# Patient Record
Sex: Female | Born: 1955 | Race: White | Hispanic: No | Marital: Married | Smoking: Never smoker
Health system: Southern US, Community
[De-identification: ages and names within clinical notes are randomized; demographics above are authoritative.]

## PROBLEM LIST (undated history)

## (undated) DIAGNOSIS — K469 Unspecified abdominal hernia without obstruction or gangrene: Secondary | ICD-10-CM

## (undated) DIAGNOSIS — R5383 Other fatigue: Secondary | ICD-10-CM

## (undated) DIAGNOSIS — R51 Headache: Secondary | ICD-10-CM

## (undated) DIAGNOSIS — R1013 Epigastric pain: Secondary | ICD-10-CM

## (undated) DIAGNOSIS — R1310 Dysphagia, unspecified: Secondary | ICD-10-CM

## (undated) DIAGNOSIS — R519 Headache, unspecified: Secondary | ICD-10-CM

## (undated) DIAGNOSIS — K219 Gastro-esophageal reflux disease without esophagitis: Secondary | ICD-10-CM

## (undated) DIAGNOSIS — M1990 Unspecified osteoarthritis, unspecified site: Secondary | ICD-10-CM

## (undated) HISTORY — DX: Other fatigue: R53.83

## (undated) HISTORY — DX: Headache: R51

## (undated) HISTORY — DX: Unspecified abdominal hernia without obstruction or gangrene: K46.9

## (undated) HISTORY — DX: Epigastric pain: R10.13

## (undated) HISTORY — PX: SPINAL FUSION: SHX223

## (undated) HISTORY — DX: Unspecified osteoarthritis, unspecified site: M19.90

## (undated) HISTORY — DX: Dysphagia, unspecified: R13.10

## (undated) HISTORY — PX: TMJ ARTHROPLASTY: SHX1066

## (undated) HISTORY — DX: Headache, unspecified: R51.9

## (undated) HISTORY — DX: Gastro-esophageal reflux disease without esophagitis: K21.9

---

## 2003-11-28 HISTORY — PX: MENISCUS REPAIR: SHX5179

## 2009-06-07 ENCOUNTER — Ambulatory Visit (HOSPITAL_COMMUNITY): Admission: RE | Admit: 2009-06-07 | Discharge: 2009-06-07 | Payer: Self-pay | Admitting: Surgery

## 2009-06-14 ENCOUNTER — Encounter: Admission: RE | Admit: 2009-06-14 | Discharge: 2009-06-14 | Payer: Self-pay | Admitting: Surgery

## 2010-09-30 ENCOUNTER — Encounter: Admission: RE | Admit: 2010-09-30 | Discharge: 2010-09-30 | Payer: Self-pay | Admitting: Surgery

## 2010-10-27 HISTORY — PX: LAPAROSCOPIC GASTRIC BANDING: SHX1100

## 2010-11-15 ENCOUNTER — Ambulatory Visit (HOSPITAL_COMMUNITY)
Admission: RE | Admit: 2010-11-15 | Discharge: 2010-11-16 | Payer: Self-pay | Source: Home / Self Care | Attending: Surgery | Admitting: Surgery

## 2010-11-16 ENCOUNTER — Encounter (INDEPENDENT_AMBULATORY_CARE_PROVIDER_SITE_OTHER): Payer: Self-pay | Admitting: Surgery

## 2011-02-06 LAB — CBC
HCT: 35 % — ABNORMAL LOW (ref 36.0–46.0)
HCT: 43.3 % (ref 36.0–46.0)
Hemoglobin: 11.6 g/dL — ABNORMAL LOW (ref 12.0–15.0)
MCHC: 33.5 g/dL (ref 30.0–36.0)
MCV: 90.2 fL (ref 78.0–100.0)
MCV: 91.1 fL (ref 78.0–100.0)
RBC: 3.84 MIL/uL — ABNORMAL LOW (ref 3.87–5.11)
RDW: 12.9 % (ref 11.5–15.5)
RDW: 13.4 % (ref 11.5–15.5)
WBC: 8.1 10*3/uL (ref 4.0–10.5)

## 2011-02-06 LAB — COMPREHENSIVE METABOLIC PANEL
Alkaline Phosphatase: 58 U/L (ref 39–117)
BUN: 11 mg/dL (ref 6–23)
CO2: 29 mEq/L (ref 19–32)
Calcium: 9.7 mg/dL (ref 8.4–10.5)
GFR calc non Af Amer: >60 mL/min (ref >60)
Glucose, Bld: 91 mg/dL (ref 70–99)
Potassium: 4.1 mEq/L (ref 3.5–5.1)
Total Protein: 7.8 g/dL (ref 6.0–8.3)

## 2011-02-06 LAB — DIFFERENTIAL
Basophils Relative: 1 % (ref 0–1)
Eosinophils Relative: 0 % (ref 0–5)
Lymphocytes Relative: 14 % (ref 12–46)
Lymphs Abs: 1.1 10*3/uL (ref 0.7–4.0)
Monocytes Absolute: 0.6 10*3/uL (ref 0.1–1.0)
Monocytes Relative: 7 % (ref 3–12)
Monocytes Relative: 7 % (ref 3–12)
Neutro Abs: 1.4 10*3/uL — ABNORMAL LOW (ref 1.7–7.7)
Neutro Abs: 6.4 10*3/uL (ref 1.7–7.7)
Neutrophils Relative %: 34 % — ABNORMAL LOW (ref 43–77)

## 2011-02-06 LAB — LIPID PANEL
Cholesterol: 208 mg/dL — ABNORMAL HIGH (ref 0–200)
LDL Cholesterol: 124 mg/dL — ABNORMAL HIGH (ref 0–99)
Triglycerides: 98 mg/dL (ref <150)
VLDL: 20 mg/dL (ref 0–40)

## 2011-02-06 LAB — HEMOGLOBIN AND HEMATOCRIT, BLOOD: HCT: 37.7 % (ref 36.0–46.0)

## 2011-04-11 NOTE — Op Note (Signed)
NAMESHAKOYA, GILMORE            ACCOUNT NO.:  [id number]   MEDICAL RECORD NO.:  [id number]          PATIENT TYPE:  AMB   LOCATION:  ENDO                         FACILITY:  [facility]   PHYSICIAN:  Sandria Bales. Ezzard Standing, M.D.  DATE OF BIRTH:  Dec 25, 1955   DATE OF PROCEDURE:  06/07/2009  DATE OF DISCHARGE:  06/07/2009                               OPERATIVE REPORT   Date of Surgery ?   PREOPERATIVE DIAGNOSES:  Status post lap band placement with hiatal  hernia.   POSTOPERATIVE DIAGNOSES:  Esophagogastric junction at 35 cm, lap band  placed at 40 cm with small hiatal hernia.  No evidence of duodenal,  gastric or esophageal mass or growth.   PROCEDURE:  Esophagogastroduodenoscopy.   SURGEON:  Dr. Ezzard Standing.   FIRST ASSISTANT:  None.   ANESTHESIA:  50 mg of fentanyl, 5 mg of Versed.   COMPLICATIONS:  None.   INDICATIONS FOR PROCEDURE:  Ms. Azua is a 55 year old white female  who is a patient of Primus Bravo, nurse practitioner at [facility], who had a lap band placed in [place] in January 2008.  She gets her band fills in [place] with her last band fill 3-4  months ago.  She complains of significant reflux and was concerned about  having a possible either tumor of her esophagus or some form of reflux.   She did have some trouble with reflux and indigestion and was seen.  She  underwent an upper GI at [facility] on Apr 17, 2009, which  showed a small hiatal hernia.  They did not describe much of lap band in  the report, but anyway she asked for upper endoscopy to evaluate and see  if there was any problems.   She is seen by Dr. Wenda Low in our practice.  She understands the  risks of upper endoscopy.   PROCEDURE:  A time out was held to identify the patient and procedure.  The patient  was placed in the left lateral decubitus position.  Her throat was  anesthetized with Cetacaine.  She was given 50 mcg of fentanyl with 5 mg  of Versed.   The flexible Pentax endoscope was passed down the back of her throat  without difficulty into her stomach and into the duodenum.  I continued  to the first and second portion of the duodenum which was unremarkable.  She did have a remote history of H. pylori with ulcer disease, but I saw  no stigmata of this.  Her pylorus was widely patent without inflammation  or bleeding.  Her stomach had normal rugal folds without inflammation,  bleeding or a tumor.  I retroflexed the scope and was able to see the  undersurface of the band.  I did take photos of this and placed in her  chart.  I then pulled the scope back through the band which was about 40  cm.  Her esophagogastric junction was noted to be about 35 cm.  There  was no ulceration, no mucosal lesion, and again her band defect was at  40 cm and her esophagogastric junction at 35.  I took pictures and put these in the chart.  She tolerated the procedure  well.  I talked to Dr. Daphine Deutscher about further evaluation and whether to do  anything with this hiatal hernia or not.      Sandria Bales. Ezzard Standing, M.D.  Electronically Signed     DHN/MEDQ  D:  06/07/2009  T:  06/08/2009  Job:  [id number]   cc:   Primus Bravo, NP  [facility]   [place]. Daphine Deutscher, MD  [address]  [place]  [place] [address]

## 2011-04-14 ENCOUNTER — Encounter (INDEPENDENT_AMBULATORY_CARE_PROVIDER_SITE_OTHER): Payer: Self-pay | Admitting: Surgery

## 2011-08-18 ENCOUNTER — Ambulatory Visit (INDEPENDENT_AMBULATORY_CARE_PROVIDER_SITE_OTHER): Payer: BC Managed Care – PPO | Admitting: Physician Assistant

## 2011-08-18 ENCOUNTER — Encounter (INDEPENDENT_AMBULATORY_CARE_PROVIDER_SITE_OTHER): Payer: Self-pay

## 2011-08-18 VITALS — BP 114/76 | HR 80 | Temp 97.4°F | Resp 18 | Ht 65.0 in | Wt 165.5 lb

## 2011-08-18 DIAGNOSIS — K219 Gastro-esophageal reflux disease without esophagitis: Secondary | ICD-10-CM

## 2011-08-18 DIAGNOSIS — Z4651 Encounter for fitting and adjustment of gastric lap band: Secondary | ICD-10-CM

## 2011-08-18 MED ORDER — PANTOPRAZOLE SODIUM 40 MG PO TBEC: 40.0000 mg | DELAYED_RELEASE_TABLET | Freq: Every day | ORAL | Status: DC

## 2011-08-18 NOTE — Patient Instructions (Signed)
Take protonix as prescribed. Follow-up with Dr. Ezzard Standing in the next month. Return if worse.

## 2011-08-18 NOTE — Progress Notes (Signed)
  HISTORY: TAYLEE GUNNELLS is a 55 y.o.female who received an AP-Large lap-band as a revision in December 2011 by Dr. Daphine Deutscher. She comes in today with worsening reflux. She constantly tastes gastric juices, especially at night and she is unable to sleep on her side. Food is now very irritating to her. She's very worried about sequelae of esophagitis.  VITAL SIGNS: Filed Vitals:   08/18/11 1404  BP: 114/76  Pulse: 80  Temp: 97.4 F (36.3 C)  Resp: 18    PHYSICAL EXAM: Physical exam reveals a very well-appearing 55 y.o.female in no apparent distress Neurologic: Awake, alert, oriented Psych: Bright affect, conversant Respiratory: Breathing even and unlabored. No stridor or wheezing Abdomen: Soft, nontender, nondistended to palpation. Incisions well-healed. No incisional hernias. Port easily palpated. Extremities: Atraumatic, good range of motion.  ASSESMENT: 55 y.o.  female  s/p AP-Large lap-band as a revision.   PLAN: The patient's port was accessed with a 20G Huber needle without difficulty. Clear fluid was aspirated and 0.25 mL saline was removed from the port. She had significant belching with relief of some of her abdominal pressure/bloating following the fluid removal. She has been prescribed protonix daily. We'll set her up with Dr. Ezzard Standing for discussion of possible upper endoscopy, primarily because of the length and degree of symptoms.

## 2011-08-23 ENCOUNTER — Telehealth (INDEPENDENT_AMBULATORY_CARE_PROVIDER_SITE_OTHER): Payer: Self-pay

## 2011-08-23 DIAGNOSIS — K219 Gastro-esophageal reflux disease without esophagitis: Secondary | ICD-10-CM

## 2011-08-23 NOTE — Telephone Encounter (Signed)
I called pt and notified her that Dr Ezzard Standing recommends she have an UGI and then follow up with Dr Daphine Deutscher since he did a revision in December.  Mardelle Matte saw the pt last week and recommend she see Dr Ezzard Standing and have an upper endo.  We will set up the UGI and have her see Dr Daphine Deutscher soon.

## 2011-08-25 ENCOUNTER — Encounter (INDEPENDENT_AMBULATORY_CARE_PROVIDER_SITE_OTHER): Payer: Self-pay | Admitting: Physician Assistant

## 2011-08-25 ENCOUNTER — Ambulatory Visit
Admission: RE | Admit: 2011-08-25 | Discharge: 2011-08-25 | Disposition: A | Discharge status: Home / Self Care | Payer: BC Managed Care – PPO | Source: Ambulatory Visit | Attending: Surgery | Admitting: Surgery

## 2011-08-25 ENCOUNTER — Ambulatory Visit (INDEPENDENT_AMBULATORY_CARE_PROVIDER_SITE_OTHER): Payer: BC Managed Care – PPO | Admitting: Physician Assistant

## 2011-08-25 VITALS — BP 116/78 | HR 64 | Temp 97.5°F | Resp 16 | Ht 65.0 in | Wt 167.2 lb

## 2011-08-25 DIAGNOSIS — Z4651 Encounter for fitting and adjustment of gastric lap band: Secondary | ICD-10-CM

## 2011-08-25 NOTE — Patient Instructions (Signed)
Follow-up with Dr. Martin. 

## 2011-08-25 NOTE — Progress Notes (Signed)
  HISTORY: Beth Wright is a 55 y.o.female who received an AP-Large lap-band in December 2011 by Dr. Daphine Deutscher. She was seen last week for obstructive symptoms and a small amount of fluid was removed. She underwent an upper GI today which revealed persistent obstruction, with some retained solid food. She's been on liquids for a week until last night when she had broccoli, which she regurgitated.  VITAL SIGNS: Filed Vitals:   08/25/11 1126  BP: 116/78  Pulse: 64  Temp: 97.5 F (36.4 C)  Resp: 16    PHYSICAL EXAM: Physical exam reveals a very well-appearing 55 y.o.female in no apparent distress Neurologic: Awake, alert, oriented Psych: Bright affect, conversant Respiratory: Breathing even and unlabored. No stridor or wheezing Abdomen: Soft, nontender, nondistended to palpation. Incisions well-healed. No incisional hernias. Port easily palpated. Extremities: Atraumatic, good range of motion.  ASSESMENT: 55 y.o.  female  s/p AP-Large lap-band as a revision from a 10 cm band.   PLAN: The patient's port was accessed with a 20G Huber needle without difficulty. Clear fluid was aspirated and 1.0 mL saline was removed from the port. She felt considerably better and it felt as if the retained food went down. She's to see Dr. Daphine Deutscher at the next available opportunity.

## 2011-08-28 ENCOUNTER — Telehealth (INDEPENDENT_AMBULATORY_CARE_PROVIDER_SITE_OTHER): Payer: Self-pay | Admitting: General Surgery

## 2011-08-28 NOTE — Telephone Encounter (Signed)
Contacted patient and she is feeling better, no n/v will call if any further problems

## 2011-09-06 DIAGNOSIS — I83893 Varicose veins of bilateral lower extremities with other complications: Secondary | ICD-10-CM

## 2011-09-06 HISTORY — DX: Varicose veins of bilateral lower extremities with other complications: I83.893

## 2011-09-07 ENCOUNTER — Encounter (INDEPENDENT_AMBULATORY_CARE_PROVIDER_SITE_OTHER): Payer: BC Managed Care – PPO | Admitting: Surgery

## 2011-09-14 ENCOUNTER — Ambulatory Visit (INDEPENDENT_AMBULATORY_CARE_PROVIDER_SITE_OTHER): Payer: BC Managed Care – PPO | Admitting: Surgery

## 2011-09-14 ENCOUNTER — Encounter (INDEPENDENT_AMBULATORY_CARE_PROVIDER_SITE_OTHER): Payer: Self-pay | Admitting: Surgery

## 2011-09-14 VITALS — BP 114/82 | HR 64 | Temp 98.2°F | Resp 20 | Ht 65.0 in | Wt 167.4 lb

## 2011-09-14 DIAGNOSIS — Z4651 Encounter for fitting and adjustment of gastric lap band: Secondary | ICD-10-CM

## 2011-09-14 DIAGNOSIS — Z9884 Bariatric surgery status: Secondary | ICD-10-CM

## 2011-09-14 NOTE — Patient Instructions (Signed)

## 2011-09-14 NOTE — Progress Notes (Signed)
Beth Wright comes in today after laser facial surgery and some perimenopausal menses...both with fluid retention.  She wanted some fluid added to her band. I placed 0.5 cc to the band and she was able to drink fine.  She suspects that flying may be affecting her band tightness. She is traveling a lot by air. Today's BMI is 27.5. Weight 167.6.Marland Kitchen  She is getting some insight into her band and its tightness with fluid retention. We'll see she's done about 6 weeks. She is somewhat frustrated she's not able to lose the weight quite rapidly. Plan

## 2011-10-12 DIAGNOSIS — N951 Menopausal and female climacteric states: Secondary | ICD-10-CM

## 2011-10-12 DIAGNOSIS — R002 Palpitations: Secondary | ICD-10-CM

## 2011-10-12 HISTORY — DX: Menopausal and female climacteric states: N95.1

## 2011-10-12 HISTORY — DX: Palpitations: R00.2

## 2011-11-13 DIAGNOSIS — I781 Nevus, non-neoplastic: Secondary | ICD-10-CM

## 2011-11-13 HISTORY — DX: Nevus, non-neoplastic: I78.1

## 2011-11-29 IMAGING — RF DG UGI W/ KUB
11 of 14 series · 18 of 24 positions shown · non-contrast
Comparison: Postop study of 11/16/2010

CLINICAL DATA: Esophagitis, possible reflux

UPPER GI SERIES WITH KUB
TECHNIQUE: Routine upper GI series was performed with thin barium
Fluoroscopy Time: 1.4 minutes

[Series 1: run · 1 of 7 slices shown (1 of 10)]
[im 1/7]
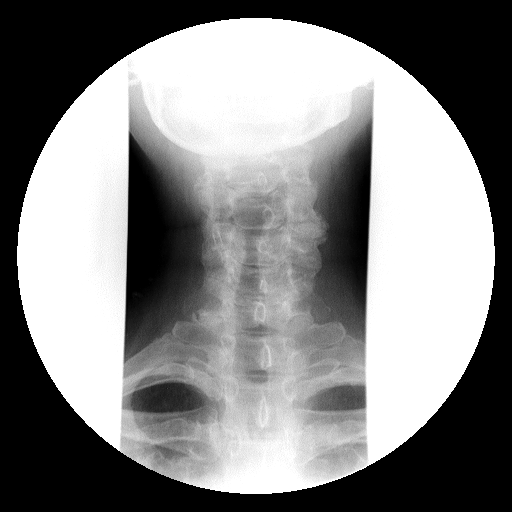

[Series 2: run · 8 of 21 slices shown (2 of 10)]
[im 3/21]
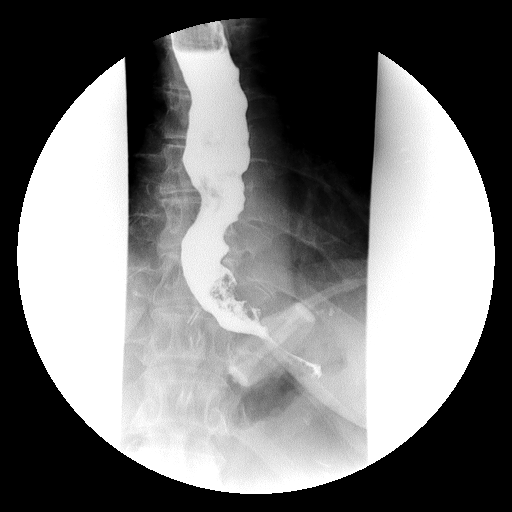
[im 5/21]
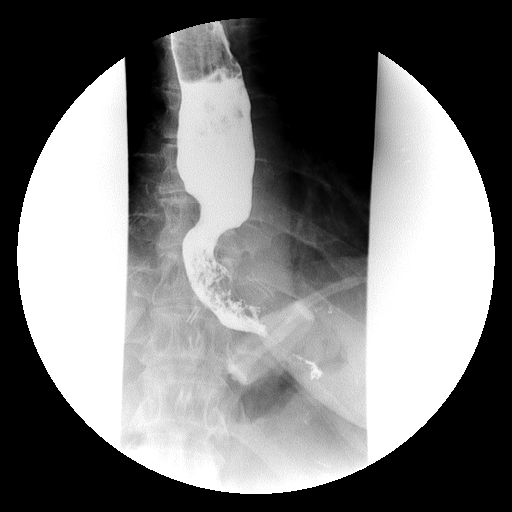
[im 7/21]
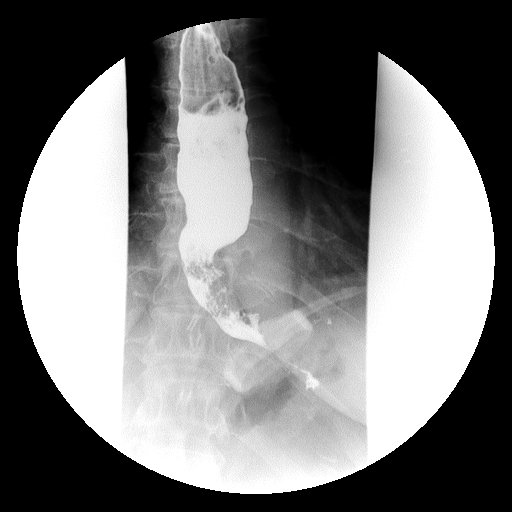
[im 11/21]
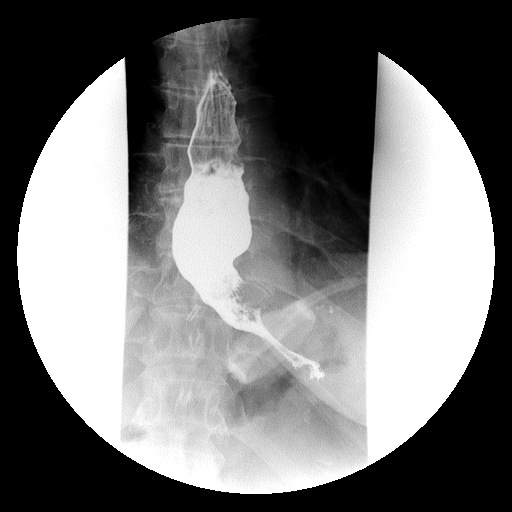
[im 13/21]
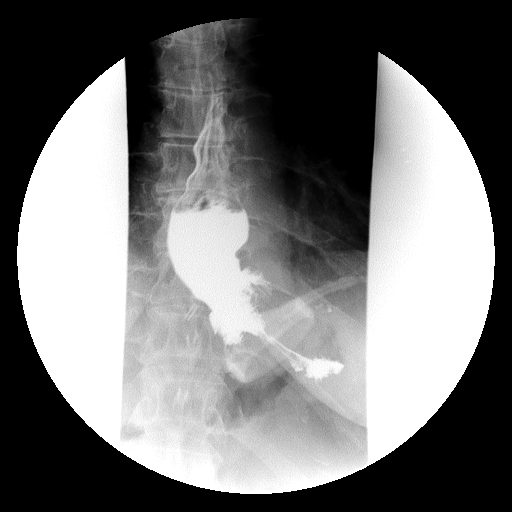
[im 15/21]
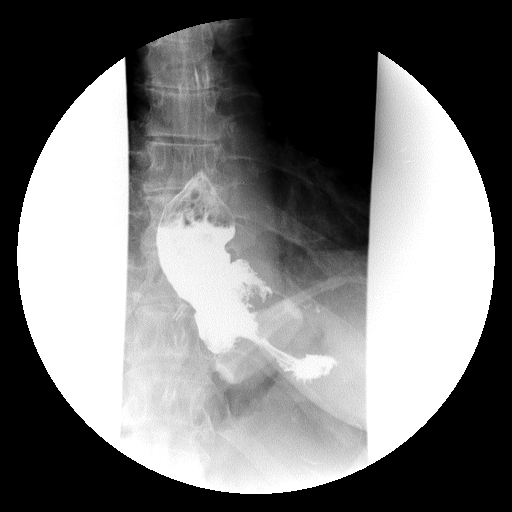
[im 19/21]
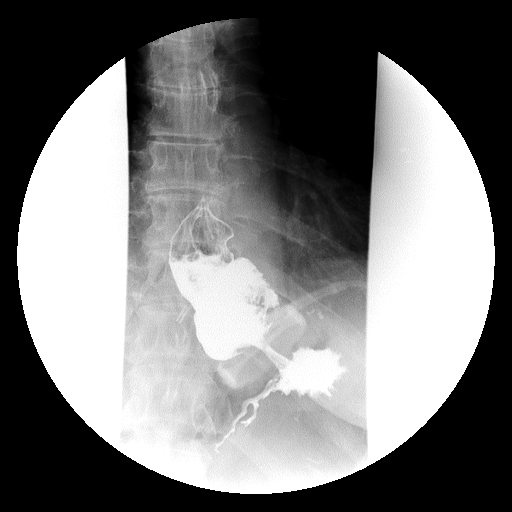
[im 21/21]
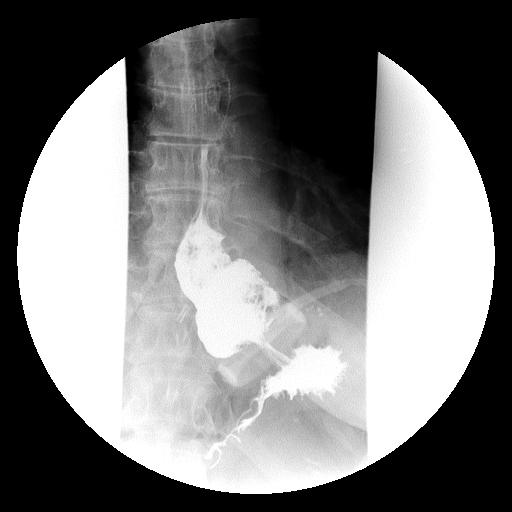

[Series 3: run · 1 of 1 slices shown (3 of 10)]
[im 1/1]
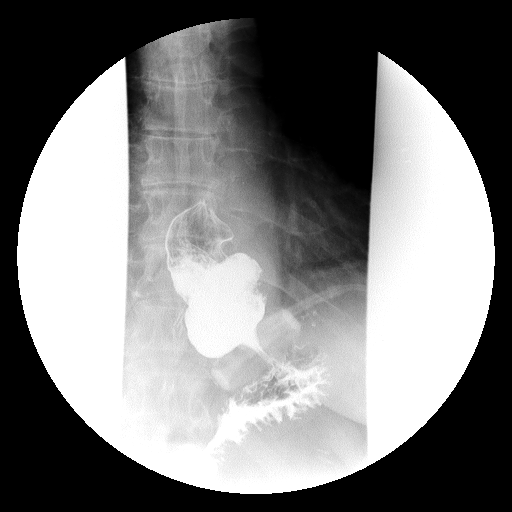

[Series 5: run · 1 of 1 slices shown (4 of 10)]
[im 1/1]
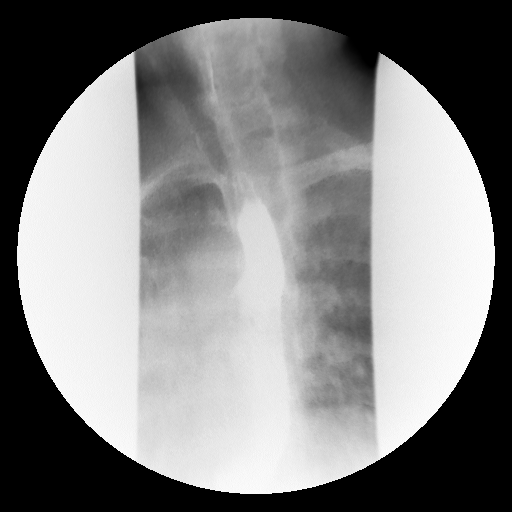

[Series 6: run · 1 of 1 slices shown (5 of 10)]
[im 1/1]
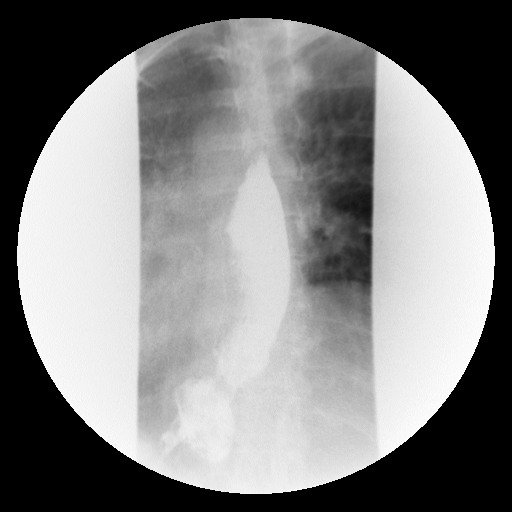

[Series 7: run · 1 of 1 slices shown (6 of 10)]
[im 1/1]
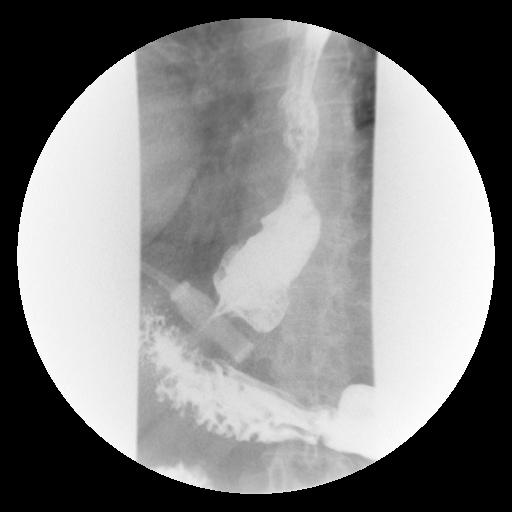

[Series 9: run · 1 of 1 slices shown (7 of 10)]
[im 1/1]
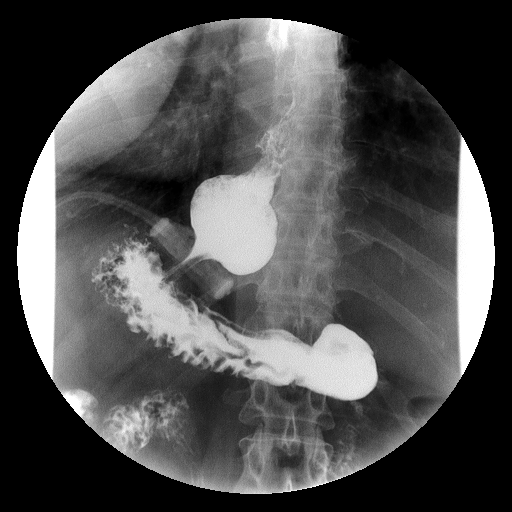

[Series 10: run · 1 of 1 slices shown (8 of 10)]
[im 1/1]
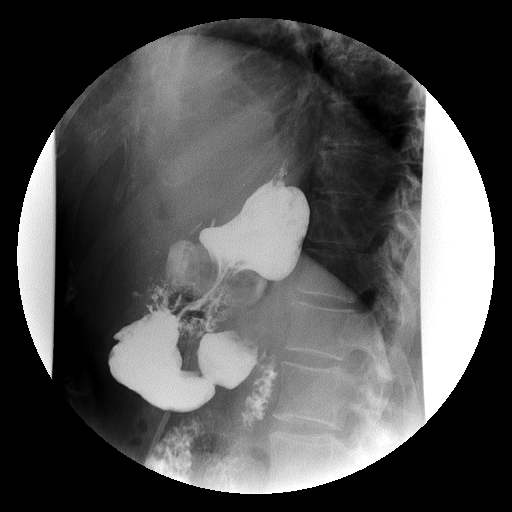

[Series 11: run · 1 of 1 slices shown (9 of 10)]
[im 1/1]
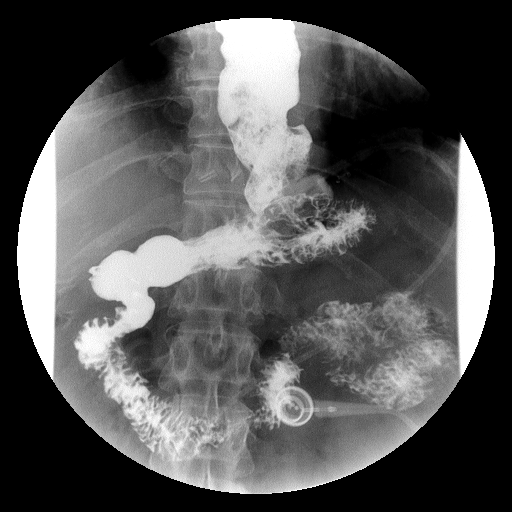

[Series 13: run · 1 of 1 slices shown (10 of 10)]
[im 1/1]
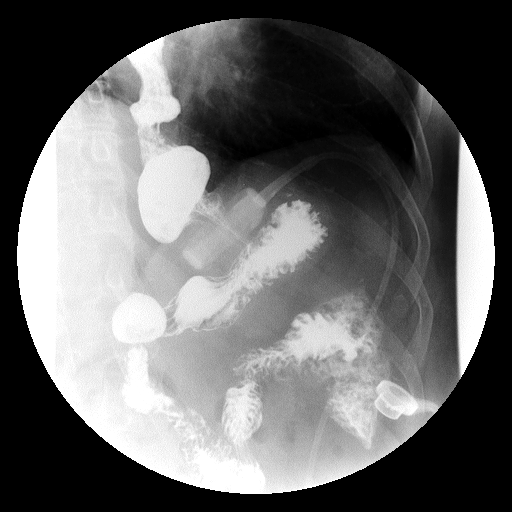

[Series 1001: view not recorded · 0.20mm/px · 1 of 1 slices shown]
[im 1/1]
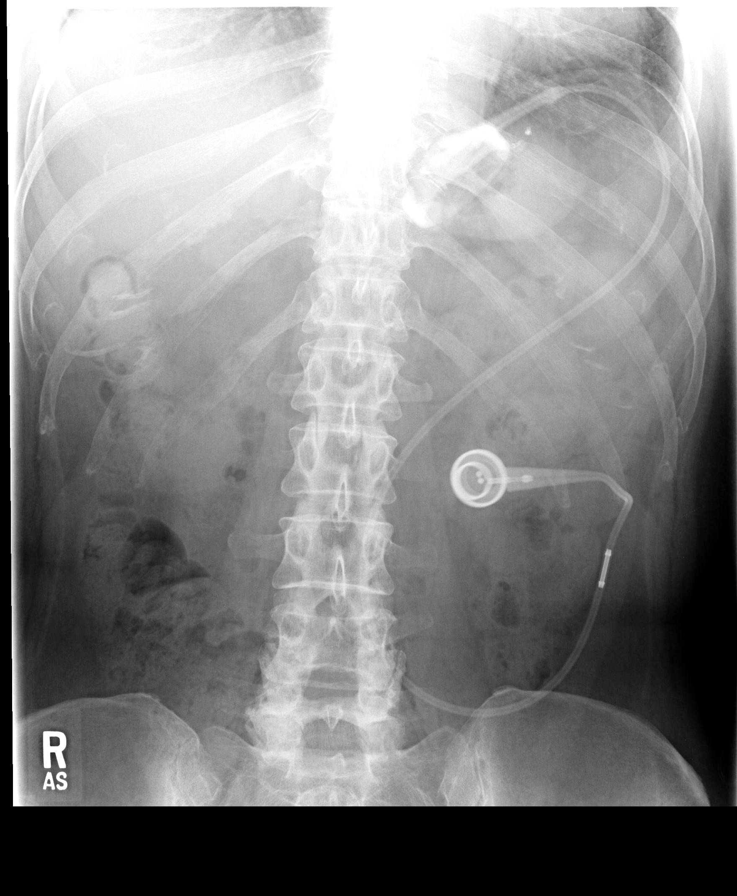

[18 of 24 positions shown; findings below may reference images not displayed]

FINDINGS: A preliminary film of the abdomen shows a nonspecific
bowel gas pattern.  A lap band is present extending from the 2
o'clock to 8 o'clock position.  The catheter tubing from the port
to the lap band appears intact.

Initially rapid sequence spot films of the cervical esophagus were
performed, showing normal swallowing mechanism.  Rapid sequence
spot films over the gastroesophageal junction were obtained in the
erect position.  There is food debris remaining within the distal
esophagus.  There are moderate tertiary contractions in the mid and
distal esophagus.  There is considerable delay in passage of liquid
through the narrowed lumen extending through the lap band.  Liquid
does slowly pass through the band into the stomach.  Therefore
there is poor distention of the stomach.  However no gastric
abnormality is seen.  No reflux is demonstrated.  The duodenal bulb
fills with no abnormality and the duodenal loop is in normal
position.
IMPRESSION: 1.  Tight lap band with considerable delay in passage of liquid
through the lap band.
2.  Retained food debris within the distal esophagus with moderate
tertiary contractions within the mid and distal esophagus.
3.  Suboptimal distention of the stomach but no gross abnormality.

## 2011-12-29 ENCOUNTER — Ambulatory Visit (INDEPENDENT_AMBULATORY_CARE_PROVIDER_SITE_OTHER): Payer: BC Managed Care – PPO | Admitting: Physician Assistant

## 2011-12-29 ENCOUNTER — Encounter (INDEPENDENT_AMBULATORY_CARE_PROVIDER_SITE_OTHER): Payer: Self-pay

## 2011-12-29 VITALS — BP 122/84 | HR 68 | Temp 97.7°F | Resp 18 | Ht 65.0 in | Wt 168.6 lb

## 2011-12-29 DIAGNOSIS — Z4651 Encounter for fitting and adjustment of gastric lap band: Secondary | ICD-10-CM

## 2011-12-29 NOTE — Patient Instructions (Signed)
Take clear liquids for the next 48 hours. Thin protein shakes are ok to start on Saturday evening. You may begin foods with the consistency of yogurt, cottage cheese, cream soups, etc. on Sunday. Call us if you have persistent vomiting or regurgitation, night cough or reflux symptoms. Return as scheduled or sooner if you notice no changes in hunger/portion sizes.   

## 2011-12-29 NOTE — Progress Notes (Signed)
  HISTORY: Beth Wright is a 56 y.o.female who received an AP-Standard lap-band in January 2008 by Dr. Daphine Deutscher. She comes in today with increased hunger and portion sizes. No vomiting or regurgitation.  VITAL SIGNS: Filed Vitals:   12/29/11 1433  BP: 122/84  Pulse: 68  Temp: 97.7 F (36.5 C)  Resp: 18    PHYSICAL EXAM: Physical exam reveals a very well-appearing 56 y.o.female in no apparent distress Neurologic: Awake, alert, oriented Psych: Bright affect, conversant Respiratory: Breathing even and unlabored. No stridor or wheezing Abdomen: Soft, nontender, nondistended to palpation. Incisions well-healed. No incisional hernias. Port easily palpated. Extremities: Atraumatic, good range of motion.  ASSESMENT: 56 y.o.  female  s/p AP-Standard lap-band.   PLAN: The patient's port was accessed with a 20G Huber needle without difficulty. Clear fluid was aspirated and 0.5 mL saline was added to the port. The patient was able to swallow water without difficulty following the procedure and was instructed to take clear liquids for the next 24-48 hours and advance slowly as tolerated.

## 2012-02-14 ENCOUNTER — Encounter (INDEPENDENT_AMBULATORY_CARE_PROVIDER_SITE_OTHER): Payer: Self-pay | Admitting: Surgery

## 2012-02-15 ENCOUNTER — Ambulatory Visit (INDEPENDENT_AMBULATORY_CARE_PROVIDER_SITE_OTHER): Payer: BC Managed Care – PPO | Admitting: Physician Assistant

## 2012-02-15 ENCOUNTER — Encounter (INDEPENDENT_AMBULATORY_CARE_PROVIDER_SITE_OTHER): Payer: Self-pay

## 2012-02-15 VITALS — BP 120/76 | HR 68 | Temp 97.8°F | Resp 18 | Ht 65.0 in | Wt 168.6 lb

## 2012-02-15 DIAGNOSIS — Z4651 Encounter for fitting and adjustment of gastric lap band: Secondary | ICD-10-CM

## 2012-02-15 NOTE — Patient Instructions (Signed)
Take clear liquids tonight. Thin protein shakes are ok to start tomorrow morning. Slowly advance your diet thereafter. Call us if you have persistent vomiting or regurgitation, night cough or reflux symptoms. Return as scheduled or sooner if you notice no changes in hunger/portion sizes.  

## 2012-02-15 NOTE — Progress Notes (Signed)
  HISTORY: SUNDAY KLOS is a 56 y.o.female who received an AP-Large lap-band as a revision in December 2011 by Dr. Daphine Deutscher. She comes in with continued hunger and larger than desired portion sizes but no vomiting or regurgitation. She has not lost or gained any weight since her last adjustment.  VITAL SIGNS: Filed Vitals:   02/15/12 1116  BP: 120/76  Pulse: 68  Temp: 97.8 F (36.6 C)  Resp: 18    PHYSICAL EXAM: Physical exam reveals a very well-appearing 56 y.o.female in no apparent distress Neurologic: Awake, alert, oriented Psych: Bright affect, conversant Respiratory: Breathing even and unlabored. No stridor or wheezing Abdomen: Soft, nontender, nondistended to palpation. Incisions well-healed. No incisional hernias. Port easily palpated. Extremities: Atraumatic, good range of motion.  ASSESMENT: 56 y.o.  female  s/p AP-Large lap-band.   PLAN: The patient's port was accessed with a 20G Huber needle without difficulty. Clear fluid was aspirated and 0.5 mL saline was added to the port. The patient was able to swallow water without difficulty following the procedure and was instructed to take clear liquids for the next 24-48 hours and advance slowly as tolerated.

## 2012-03-28 ENCOUNTER — Encounter (INDEPENDENT_AMBULATORY_CARE_PROVIDER_SITE_OTHER): Payer: BC Managed Care – PPO

## 2012-05-01 ENCOUNTER — Encounter (INDEPENDENT_AMBULATORY_CARE_PROVIDER_SITE_OTHER): Payer: Self-pay

## 2012-05-28 ENCOUNTER — Ambulatory Visit (INDEPENDENT_AMBULATORY_CARE_PROVIDER_SITE_OTHER): Payer: BC Managed Care – PPO | Admitting: General Surgery

## 2012-05-28 VITALS — BP 134/86 | HR 68 | Temp 98.4°F | Resp 16 | Ht 65.5 in | Wt 156.8 lb

## 2012-05-28 DIAGNOSIS — Z9884 Bariatric surgery status: Secondary | ICD-10-CM

## 2012-05-28 NOTE — Progress Notes (Signed)
History: Patient comes in the office with history of Latin man placed initially in [place] in 2008 and then residual surgery by Dr. Daphine Deutscher in December of 2011. She last had a fill by Mardelle Matte of 0.5 cc February 15, 2012. She had appropriate restriction and had been doing well until about the last 2-3 weeks. She has noted progressively worsening dysphagia initially of solid food with vomiting on almost daily basis and in the last week or so occasional intolerance of fluids in the morning. She generally is able to tolerate fluids well in the evening. She also has worsening nighttime heartburn.  Exam: Weight is 156.8 which is down 12 pounds from March. General: Does not appear ill. Abdomen: Soft and nontender. Port site looks fine and is slightly tender but not inflamed.  Assessment and plan: History LAP-BAND revision and now symptoms over restriction. After discussion we removed 0.5 cc. She interestingly had belching after this and felt better. She was able to tolerate water well. She will call as needed if this does not relieve her symptoms

## 2012-06-07 ENCOUNTER — Encounter (INDEPENDENT_AMBULATORY_CARE_PROVIDER_SITE_OTHER): Payer: BC Managed Care – PPO | Admitting: General Surgery

## 2012-08-15 ENCOUNTER — Encounter (INDEPENDENT_AMBULATORY_CARE_PROVIDER_SITE_OTHER): Payer: Self-pay | Admitting: Physician Assistant

## 2012-08-15 ENCOUNTER — Ambulatory Visit (INDEPENDENT_AMBULATORY_CARE_PROVIDER_SITE_OTHER): Payer: BC Managed Care – PPO | Admitting: Physician Assistant

## 2012-08-15 VITALS — BP 134/82 | HR 80 | Ht 65.0 in | Wt 160.8 lb

## 2012-08-15 DIAGNOSIS — Z4651 Encounter for fitting and adjustment of gastric lap band: Secondary | ICD-10-CM

## 2012-08-15 NOTE — Progress Notes (Signed)
  HISTORY: Beth Wright is a 56 y.o.female who received an AP-Large lap-band in December 2011 by Dr. Daphine Deutscher. She comes in with increasing hunger and portion sizes following a fluid removal in July for acute obstructive symptoms. She had been doing very well in the months prior to that incident. She would like a fill to return to that volume today.  VITAL SIGNS: Filed Vitals:   08/15/12 1032  BP: 134/82  Pulse: 80    PHYSICAL EXAM: Physical exam reveals a very well-appearing 56 y.o.female in no apparent distress Neurologic: Awake, alert, oriented Psych: Bright affect, conversant Respiratory: Breathing even and unlabored. No stridor or wheezing Abdomen: Soft, nontender, nondistended to palpation. Incisions well-healed. No incisional hernias. Port easily palpated. Extremities: Atraumatic, good range of motion.  ASSESMENT: 56 y.o.  female  s/p AP-Large lap-band.   PLAN: The patient's port was accessed with a 20G Huber needle without difficulty. Clear fluid was aspirated and 0.5 mL saline was added to the port. The patient was able to swallow water without difficulty following the procedure and was instructed to take clear liquids for the next 24-48 hours and advance slowly as tolerated.

## 2012-08-15 NOTE — Patient Instructions (Signed)
Take clear liquids tonight. Thin protein shakes are ok to start tomorrow morning. Slowly advance your diet thereafter. Call us if you have persistent vomiting or regurgitation, night cough or reflux symptoms. Return as scheduled or sooner if you notice no changes in hunger/portion sizes.  

## 2012-08-29 ENCOUNTER — Encounter (INDEPENDENT_AMBULATORY_CARE_PROVIDER_SITE_OTHER): Payer: BC Managed Care – PPO

## 2012-10-17 ENCOUNTER — Encounter (INDEPENDENT_AMBULATORY_CARE_PROVIDER_SITE_OTHER): Payer: BC Managed Care – PPO

## 2013-01-13 ENCOUNTER — Telehealth (INDEPENDENT_AMBULATORY_CARE_PROVIDER_SITE_OTHER): Payer: Self-pay | Admitting: General Surgery

## 2013-01-13 ENCOUNTER — Encounter (INDEPENDENT_AMBULATORY_CARE_PROVIDER_SITE_OTHER): Payer: Self-pay | Admitting: General Surgery

## 2013-01-13 ENCOUNTER — Ambulatory Visit (INDEPENDENT_AMBULATORY_CARE_PROVIDER_SITE_OTHER): Payer: BC Managed Care – PPO | Admitting: General Surgery

## 2013-01-13 ENCOUNTER — Telehealth (INDEPENDENT_AMBULATORY_CARE_PROVIDER_SITE_OTHER): Payer: Self-pay

## 2013-01-13 VITALS — BP 119/81 | HR 80 | Temp 98.4°F | Resp 14 | Ht 65.0 in | Wt 162.2 lb

## 2013-01-13 DIAGNOSIS — R1310 Dysphagia, unspecified: Secondary | ICD-10-CM

## 2013-01-13 DIAGNOSIS — Z9884 Bariatric surgery status: Secondary | ICD-10-CM

## 2013-01-13 NOTE — Telephone Encounter (Signed)
Pt called to ask about her lap band.  She has begun to experience severe reflux, worse at night, and having some vomiting.  She was advised last week to go back to protein shakes and liquids only, but the situation has not improved.  She thinks she needs relief from the lap band.  Offered appt with Dr. Biagio Quint at Urgent office, which she accepted.

## 2013-01-13 NOTE — Progress Notes (Signed)
Subjective:     Patient ID: Beth Wright, female   DOB: 10/18/1956, 57 y.o.   MRN: [id number]  HPI The patient has a history of left and placed in 2008 with subsequent replacement of an AP large band by Dr. Daphine Deutscher. She says that she has been doing well since her last adjustment in September of 2013 the last 2 weeks she began having severe and frequent reflux. She also has some nighttime cough and regurgitation and has been doing about 4-5 times over the last week. She's has a solitary deftly harder than liquids. She says that she flies frequently and notices a difference when she flies.he.   Review of Systems     Objective:   Physical Exam I aspirated clear fluid from her band of and measured approximately 9 cc in her band and aspirated about 1-1.4 cc    Assessment:     Dysphasia status post lap band She seemed to be feeling much better after her that band adjustment and after removing 1-1.4 cc of fluid. She was tolerating liquids. I recommended that she have an upper GI and that she followup with her surgeon in about 2 weeks for repeat evaluation. She will go on post adjustment diet     Plan:      upper GI and followup with Dr. Daphine Deutscher in about 2 weeks

## 2013-01-13 NOTE — Telephone Encounter (Signed)
UGI scheduled for Thursday 01/23/13 @ 9:30 am @ [facility] [address]  Patient aware

## 2013-01-23 ENCOUNTER — Other Ambulatory Visit: Payer: BC Managed Care – PPO

## 2013-01-28 ENCOUNTER — Encounter (INDEPENDENT_AMBULATORY_CARE_PROVIDER_SITE_OTHER): Payer: BC Managed Care – PPO | Admitting: Surgery

## 2013-02-03 ENCOUNTER — Ambulatory Visit
Admission: RE | Admit: 2013-02-03 | Discharge: 2013-02-03 | Disposition: A | Discharge status: Home / Self Care | Payer: BC Managed Care – PPO | Source: Ambulatory Visit | Attending: General Surgery | Admitting: General Surgery

## 2013-02-03 DIAGNOSIS — R1310 Dysphagia, unspecified: Secondary | ICD-10-CM

## 2013-02-20 ENCOUNTER — Encounter (INDEPENDENT_AMBULATORY_CARE_PROVIDER_SITE_OTHER): Payer: BC Managed Care – PPO | Admitting: Surgery

## 2013-03-07 ENCOUNTER — Ambulatory Visit (INDEPENDENT_AMBULATORY_CARE_PROVIDER_SITE_OTHER): Payer: BC Managed Care – PPO | Admitting: Surgery

## 2013-03-07 ENCOUNTER — Encounter (INDEPENDENT_AMBULATORY_CARE_PROVIDER_SITE_OTHER): Payer: Self-pay | Admitting: Surgery

## 2013-03-07 VITALS — BP 140/82 | HR 72 | Temp 98.6°F | Resp 16 | Ht 65.0 in | Wt 164.8 lb

## 2013-03-07 DIAGNOSIS — Z9884 Bariatric surgery status: Secondary | ICD-10-CM

## 2013-03-07 DIAGNOSIS — Z4651 Encounter for fitting and adjustment of gastric lap band: Secondary | ICD-10-CM

## 2013-03-07 DIAGNOSIS — Z9889 Other specified postprocedural states: Secondary | ICD-10-CM

## 2013-03-07 HISTORY — DX: Other specified postprocedural states: Z98.890

## 2013-03-07 HISTORY — DX: Encounter for fitting and adjustment of gastric lap band: Z46.51

## 2013-03-07 NOTE — Progress Notes (Signed)
Lapband Fill Encounter Problem List:  There is no problem list on file for this patient.   Beth Wright [name] Body mass index is 27.42 kg/(m^2). Weight loss since surgery  27  Having regurgitation?:  no  Feel that they need a fill?  yes  Nocturnal reflux?  no  Amount of fill  0.5   Port site: Accessed easily.  She gets full when she flys.  I got out about 1/2 cc of air and added 0.5 cc of liquid.  She drank ok.  Will see back in 3 months  Instructions given and weight loss goals discussed.    Matt B. Daphine Deutscher, MD, FACS

## 2013-03-07 NOTE — Patient Instructions (Signed)

## 2013-04-03 ENCOUNTER — Encounter (INDEPENDENT_AMBULATORY_CARE_PROVIDER_SITE_OTHER): Payer: Self-pay

## 2013-04-03 ENCOUNTER — Ambulatory Visit (INDEPENDENT_AMBULATORY_CARE_PROVIDER_SITE_OTHER): Payer: BC Managed Care – PPO | Admitting: Physician Assistant

## 2013-04-03 VITALS — BP 112/68 | HR 79 | Temp 97.5°F | Resp 18 | Ht 65.5 in | Wt 164.8 lb

## 2013-04-03 DIAGNOSIS — Z4651 Encounter for fitting and adjustment of gastric lap band: Secondary | ICD-10-CM

## 2013-04-03 NOTE — Patient Instructions (Signed)
Take clear liquids tonight. Thin protein shakes are ok to start tomorrow morning. Slowly advance your diet thereafter. Call us if you have persistent vomiting or regurgitation, night cough or reflux symptoms. Return as scheduled or sooner if you notice no changes in hunger/portion sizes.  

## 2013-04-03 NOTE — Progress Notes (Signed)
  HISTORY: Beth Wright is a 57 y.o.female who received an AP-Standard lap-band in January 2008 with revision in December 2011 by Dr. Daphine Deutscher. She comes in with no change in weight but consistent hunger and larger than desired portion sizes. She denies regurgitation or reflux. She had 1.25 mL removed by Dr. Biagio Quint in February for obstruction, most likely due to air travel. Dr. Daphine Deutscher replace 0.5 mL of that last month. She has curtailed her air travel since then. She believes this is the proximate cause of most of her issues.  VITAL SIGNS: Filed Vitals:   04/03/13 1221  BP: 112/68  Pulse: 79  Temp: 97.5 F (36.4 C)  Resp: 18    PHYSICAL EXAM: Physical exam reveals a very well-appearing 57 y.o.female in no apparent distress Neurologic: Awake, alert, oriented Psych: Bright affect, conversant Respiratory: Breathing even and unlabored. No stridor or wheezing Abdomen: Soft, nontender, nondistended to palpation. Incisions well-healed. No incisional hernias. Port easily palpated. Extremities: Atraumatic, good range of motion.  ASSESMENT: 57 y.o.  female  s/p AP-Standard lap-band.   PLAN: The patient's port was accessed with a 20G Huber needle without difficulty. Clear fluid was aspirated and 0.5 mL saline was added to the port. The patient was able to swallow water without difficulty following the procedure and was instructed to take clear liquids for the next 24-48 hours and advance slowly as tolerated.

## 2013-05-22 ENCOUNTER — Ambulatory Visit (INDEPENDENT_AMBULATORY_CARE_PROVIDER_SITE_OTHER): Payer: BC Managed Care – PPO | Admitting: Physician Assistant

## 2013-05-22 ENCOUNTER — Encounter (INDEPENDENT_AMBULATORY_CARE_PROVIDER_SITE_OTHER): Payer: Self-pay

## 2013-05-22 ENCOUNTER — Encounter (INDEPENDENT_AMBULATORY_CARE_PROVIDER_SITE_OTHER): Payer: BC Managed Care – PPO

## 2013-05-22 VITALS — BP 110/72 | HR 80 | Temp 98.0°F | Resp 16 | Ht 65.0 in | Wt 163.6 lb

## 2013-05-22 DIAGNOSIS — Z4651 Encounter for fitting and adjustment of gastric lap band: Secondary | ICD-10-CM

## 2013-05-22 NOTE — Patient Instructions (Signed)
Take clear liquids tonight. Thin protein shakes are ok to start tomorrow morning. Slowly advance your diet thereafter. Call us if you have persistent vomiting or regurgitation, night cough or reflux symptoms. Return as scheduled or sooner if you notice no changes in hunger/portion sizes.  

## 2013-05-22 NOTE — Progress Notes (Signed)
  HISTORY: Beth Wright is a 57 y.o.female who received an AP-Large lap-band in December 2011 by Dr. Daphine Deutscher as a revision of a AP-Standard band in January 2008 done in [place].  Her weight is stable since her last visit in early May. She describes increased hunger and portion sizes. She would like a fill today to keep her weight under control.  VITAL SIGNS: Filed Vitals:   05/22/13 0845  BP: 110/72  Pulse: 80  Temp: 98 F (36.7 C)  Resp: 16    PHYSICAL EXAM: Physical exam reveals a very well-appearing 57 y.o.female in no apparent distress Neurologic: Awake, alert, oriented Psych: Bright affect, conversant Respiratory: Breathing even and unlabored. No stridor or wheezing Abdomen: Soft, nontender, nondistended to palpation. Incisions well-healed. No incisional hernias. Port easily palpated. Extremities: Atraumatic, good range of motion.  ASSESMENT: 57 y.o.  female  s/p AP-Large lap-band.   PLAN: The patient's port was accessed with a 20G Huber needle without difficulty. Clear fluid was aspirated and 0.5 mL saline was added to the port. The patient was able to swallow water without difficulty following the procedure and was instructed to take clear liquids for the next 24-48 hours and advance slowly as tolerated.

## 2013-07-08 ENCOUNTER — Ambulatory Visit (INDEPENDENT_AMBULATORY_CARE_PROVIDER_SITE_OTHER): Payer: BC Managed Care – PPO | Admitting: Surgery

## 2013-07-08 ENCOUNTER — Encounter (INDEPENDENT_AMBULATORY_CARE_PROVIDER_SITE_OTHER): Payer: Self-pay | Admitting: Surgery

## 2013-07-08 VITALS — BP 128/68 | HR 72 | Temp 99.1°F | Resp 16 | Ht 65.0 in | Wt 164.8 lb

## 2013-07-08 DIAGNOSIS — Z9884 Bariatric surgery status: Secondary | ICD-10-CM

## 2013-07-08 NOTE — Progress Notes (Signed)
Lapband Fill Encounter Problem List:   Patient Active Problem List   Diagnosis Date Noted  . Fitting and adjustment of gastric lap band 03/07/2013  . 10 cm Lapband [place]-changed out to APL in 2011 03/07/2013    Beth Wright Body mass index is 27.42 kg/(m^2). Weight loss since surgery  --  Having regurgitation?:  no  Feel that they need a fill?  ---  Nocturnal reflux?  ---  Amount of fill  - 0.5     Instructions given and weight loss goals discussed.    Beth Wright is having a cervical fusion September 17 at [facility] by Dr. Alvester Morin. She is having a lot of numbness in her left arm. She was concerned issues doing a lot of flying until then and was afraid of being too tight canal and during her surgery. It may be that she will need a band vacation around the time of her surgery. We discussed it and she wanted to have half a cc were removed which I did. We will be seeing her back probably after her cervical fusion and before the holidays.  Matt B. Daphine Deutscher, MD, FACS

## 2013-07-24 ENCOUNTER — Encounter (INDEPENDENT_AMBULATORY_CARE_PROVIDER_SITE_OTHER): Payer: BC Managed Care – PPO

## 2013-07-31 ENCOUNTER — Encounter (INDEPENDENT_AMBULATORY_CARE_PROVIDER_SITE_OTHER): Payer: BC Managed Care – PPO

## 2013-08-21 DIAGNOSIS — M47812 Spondylosis without myelopathy or radiculopathy, cervical region: Secondary | ICD-10-CM

## 2013-08-21 DIAGNOSIS — M4712 Other spondylosis with myelopathy, cervical region: Secondary | ICD-10-CM | POA: Insufficient documentation

## 2013-08-21 HISTORY — DX: Spondylosis without myelopathy or radiculopathy, cervical region: M47.812

## 2013-08-26 ENCOUNTER — Encounter (INDEPENDENT_AMBULATORY_CARE_PROVIDER_SITE_OTHER): Payer: Self-pay

## 2013-11-06 ENCOUNTER — Encounter (INDEPENDENT_AMBULATORY_CARE_PROVIDER_SITE_OTHER): Payer: Self-pay

## 2013-11-06 ENCOUNTER — Ambulatory Visit (INDEPENDENT_AMBULATORY_CARE_PROVIDER_SITE_OTHER): Payer: BC Managed Care – PPO | Admitting: Physician Assistant

## 2013-11-06 VITALS — BP 126/88 | HR 68 | Temp 97.1°F | Resp 16 | Ht 65.0 in | Wt 165.6 lb

## 2013-11-06 DIAGNOSIS — Z4651 Encounter for fitting and adjustment of gastric lap band: Secondary | ICD-10-CM

## 2013-11-06 NOTE — Progress Notes (Signed)
  HISTORY: Beth Wright is a 57 y.o.female who received an AP-Large lap-band in December 2011 as a revision from a 10 cm Band placed in 2008 in [place] by Dr. Daphine Deutscher. She comes in after having a C4-C7 fusion performed in late September at [facility]. Dr. Daphine Deutscher had removed 0.5 mL fluid prior to her surgery. She is working her way through range of motion post operatively. She has no complaints regarding her band other than weight gain and increased portion sizes. She'd like the fluid replaced that was removed in August. She has no regurgitation or reflux symptoms.  VITAL SIGNS: Filed Vitals:   11/06/13 1030  BP: 126/88  Pulse: 68  Temp: 97.1 F (36.2 C)  Resp: 16    PHYSICAL EXAM: Physical exam reveals a very well-appearing 57 y.o.female in no apparent distress Neurologic: Awake, alert, oriented Psych: Bright affect, conversant Respiratory: Breathing even and unlabored. No stridor or wheezing Abdomen: Soft, nontender, nondistended to palpation. Incisions well-healed. No incisional hernias. Port easily palpated. Extremities: Atraumatic, good range of motion.  ASSESMENT: 57 y.o.  female  s/p AP-Large lap-band.   PLAN: The patient's port was accessed with a 20G Huber needle without difficulty. Clear fluid was aspirated and 0.5 mL saline was added to the port. The patient was able to swallow water without difficulty following the procedure and was instructed to take clear liquids for the next 24-48 hours and advance slowly as tolerated. She will call us for an appointment as needed. Return criteria were given to the patient.

## 2013-11-06 NOTE — Patient Instructions (Signed)

## 2014-03-13 ENCOUNTER — Telehealth (INDEPENDENT_AMBULATORY_CARE_PROVIDER_SITE_OTHER): Payer: Self-pay

## 2014-03-13 ENCOUNTER — Other Ambulatory Visit (INDEPENDENT_AMBULATORY_CARE_PROVIDER_SITE_OTHER): Payer: Self-pay

## 2014-03-13 MED ORDER — OMEPRAZOLE 20 MG PO CPDR: 20.0000 mg | DELAYED_RELEASE_CAPSULE | Freq: Every day | ORAL | Status: DC

## 2014-03-13 NOTE — Telephone Encounter (Signed)
Lap band patient of Dr. Hassell Done who last saw Glendale Chard, [place] in December 2014.  She is experiencing some heartburn and would like to know if the Rx can be refilled.  Pt has not had a lap band fill since December.  Pt was made aware that Dr. Hassell Done is unavailable, and the message would go to our urgent office doctor. Please advise.

## 2014-03-13 NOTE — Telephone Encounter (Signed)
Rx prescribed was Prilosec.

## 2014-04-02 ENCOUNTER — Ambulatory Visit (INDEPENDENT_AMBULATORY_CARE_PROVIDER_SITE_OTHER): Payer: BC Managed Care – PPO | Admitting: Physician Assistant

## 2014-04-02 ENCOUNTER — Encounter (INDEPENDENT_AMBULATORY_CARE_PROVIDER_SITE_OTHER): Payer: Self-pay

## 2014-04-02 VITALS — BP 126/74 | HR 75 | Temp 98.2°F | Ht 65.0 in | Wt 162.8 lb

## 2014-04-02 DIAGNOSIS — Z4651 Encounter for fitting and adjustment of gastric lap band: Secondary | ICD-10-CM

## 2014-04-02 MED ORDER — OMEPRAZOLE 20 MG PO CPDR: 20.0000 mg | DELAYED_RELEASE_CAPSULE | Freq: Every day | ORAL | Status: DC

## 2014-04-02 NOTE — Patient Instructions (Signed)
Return as needed. Focus on good food choices as well as physical activity. Return sooner if you have an increase in hunger, portion sizes or weight. Return also for difficulty swallowing, night cough, reflux.

## 2014-04-02 NOTE — Progress Notes (Signed)
  HISTORY: Beth Wright is a 58 y.o.female who received an AP-Large lap-band in January 2008 with revision in December 2011 by Dr. Hassell Done. She comes in with 3 lbs weight loss since her last visit in December. Unfortunately, she is having progressive nocturnal reflux and cough with some solid food dysphagia over the past couple of weeks. She notices this occurs when she's had a significant amount of air travel. She would like some fluid removed. She also needs a refill on Omeprazole, which she has been taking daily.  VITAL SIGNS: Filed Vitals:   04/02/14 1140  BP: 126/74  Pulse: 75  Temp: 98.2 F (36.8 C)    PHYSICAL EXAM: Physical exam reveals a very well-appearing 58 y.o.female in no apparent distress Neurologic: Awake, alert, oriented Psych: Bright affect, conversant Respiratory: Breathing even and unlabored. No stridor or wheezing Abdomen: Soft, nontender, nondistended to palpation. Incisions well-healed. No incisional hernias. Port easily palpated. Extremities: Atraumatic, good range of motion.  ASSESMENT: 58 y.o.  female  s/p AP-Large lap-band.   PLAN: The patient's port was accessed with a 20G Huber needle without difficulty. Clear fluid was aspirated and 0.5 mL saline was removed from the port. The patient was advised to concentrate on healthy food choices and to avoid slider foods high in fats and carbohydrates. She drank water with much greater ease after this. I've also refilled her omeprazole for one year. We'll have her back most likely during the summer or sooner if needed.

## 2014-11-11 ENCOUNTER — Other Ambulatory Visit: Payer: BC Managed Care – PPO

## 2014-11-11 ENCOUNTER — Ambulatory Visit
Admission: RE | Admit: 2014-11-11 | Discharge: 2014-11-11 | Disposition: A | Discharge status: Home / Self Care | Payer: BC Managed Care – PPO | Source: Ambulatory Visit | Attending: Surgery | Admitting: Surgery

## 2014-11-11 ENCOUNTER — Other Ambulatory Visit (INDEPENDENT_AMBULATORY_CARE_PROVIDER_SITE_OTHER): Payer: Self-pay | Admitting: Surgery

## 2014-11-11 DIAGNOSIS — Z9884 Bariatric surgery status: Secondary | ICD-10-CM

## 2014-11-11 DIAGNOSIS — R1031 Right lower quadrant pain: Secondary | ICD-10-CM

## 2014-11-18 DIAGNOSIS — Z833 Family history of diabetes mellitus: Secondary | ICD-10-CM

## 2014-11-18 HISTORY — DX: Family history of diabetes mellitus: Z83.3

## 2015-04-29 ENCOUNTER — Other Ambulatory Visit: Payer: Self-pay | Admitting: Surgery

## 2015-05-17 ENCOUNTER — Other Ambulatory Visit: Payer: Self-pay | Admitting: Surgery

## 2015-05-17 NOTE — H&P (Addendum)
Beth Wright 04/08/2015 1:44 PM Location: [facility] Patient #: [id number] DOB: Aug 16, 1956 Married / Language: English / Race: White Female  History of Present Illness   The patient is a 59 year old female is here for lapband followup. Beth Wright is a 59 year old patient of DR. Hassell Done who received an AP-Large Lap Band in December 2011 as a revision from a band placed in [place] in January 2008. She comes in with stable weight since her last visit three weeks ago.  She complains of slightly improved, yet persistent GERD symptoms. She is tolerating most foods but has resorted to sleeping more upright to avoid nocturnal symptoms. We removed a small amount of fluid last visit and this aided somewhat but she wants the remainder of the last fill removed today. If this doesn't allevaite her symptoms, she would like to pursue EGD as she's concerned about a possible stricture (she had one in the past).  She had an UGI on 11/12/2015 which showed Moderate tertiary contractions in the distal esophagus with slight dilatation of the distal esophagus, but again no obstruction to the passage of barium is seen.   I had done a prior endoscopy on her prior to replacement of her lap band.  She thinks that this was in 2011.  Allergies (Sonya [place], CMA; 04/08/2015 1:44 PM) No Known Drug Allergies12/01/2014  Medication History (Sonya [place], CMA; 04/08/2015 1:44 PM) Baby Aspirin (81MG  Tablet Chewable, Oral) Active. Lutein 20 (Oral) Active. Multi Vitamin Daily (Oral) Active. PriLOSEC (20MG  Capsule DR, Oral) Active. Progesterone (100MG  Capsule, Oral) Active. Vivelle-Dot (0.05MG /24HR Patch TW, Transdermal) Active. Medications Reconciled  Social History Married She works as a Civil engineer, contracting.  Vitals ([place]; 04/08/2015 1:44 PM) 04/08/2015 1:44 PM Weight: 162 lb Height: 65in Body Surface Area: 1.84 m Body Mass Index: 26.96 kg/m Pulse: 77 (Regular)  BP: 136/80 (Sitting, Left  Arm, Standard)    Physical Exam  General  Mental Status-Alert. General Appearance-Consistent with stated age.  Hydration-Well hydrated.  Voice-Normal.  Chest and Lung Exam Chest and lung exam reveals -quiet, even and easy respiratory effort with no use of accessory muscles. Inspection Chest Wall - Normal. Back - normal.  Abdomen Inspection Skin - Scar - Note: Incision: clean, dry, and intact. Palpation/Percussion Palpation and Percussion of the abdomen reveal - Soft, Non Tender, No Rebound tenderness, No Rigidity (guarding) and No hepatosplenomegaly. Note: Lap band port easily palpated. No tenderness. No hernia.     Results Jonni Sanger Liepins PA C; 04/08/2015 1:56 PM) Procedures  Name Value Date Lap Band Adjustment Procedure Procedure: Informed verbal consent was obtained for adjustment of the band. The port site was positively identified and prepped with isopropyl alcohol. The port was accessed with a 20G Huber needle and clear fluid was aspirated. 0.15 mL saline was removed from the port. A sterile bandage was applied. The patient tolerated the procedure well without complication.  Performed: 04/08/2015 1:55 PM   Assessment & Plan  1.  Her main symptom is reflux and burning with certain foods.  Since Jonni Sanger removed her fluid, she has done better, but she is still having discomfort.  2.  Lap Band (AP large)  Originally placed in [place] - Jan 2008, revision in Dec 2011 by Dr. Hassell Done.  Current weight - 162, BMI - 26.9  Impression: We'll remove the remainder of the previous fill today in hopes that this will alleviate her reflux. If not, we will set her up to see Dr. Lucia Gaskins for upper endoscopy.  Current Plans  Instructions (per Jonni Sanger): Concentrate on nutritious foods, low in fat and carbohydrates. Avoid eating close to bedtime. Return as scheduled. Call for an appointment sooner should you have reflux, night cough, regurgitation of food or drink. Return also for  persistently increased hunger or portion sizes.  3.  Cervical fusion August 13, 2013, at [facility] by Dr. Morrison Old, MD, [facility] Pager: [phone] Office phone:  [phone]

## 2015-05-18 ENCOUNTER — Ambulatory Visit (HOSPITAL_COMMUNITY)
Admission: RE | Admit: 2015-05-18 | Discharge: 2015-05-18 | Disposition: A | Discharge status: Home / Self Care | Payer: BLUE CROSS/BLUE SHIELD | Source: Ambulatory Visit | Attending: Surgery | Admitting: Surgery

## 2015-05-18 ENCOUNTER — Encounter (HOSPITAL_COMMUNITY): Payer: Self-pay

## 2015-05-18 ENCOUNTER — Encounter (HOSPITAL_COMMUNITY)
Admission: RE | Disposition: A | Discharge status: Home / Self Care | Payer: Self-pay | Source: Ambulatory Visit | Attending: Surgery

## 2015-05-18 DIAGNOSIS — Z9884 Bariatric surgery status: Secondary | ICD-10-CM | POA: Insufficient documentation

## 2015-05-18 DIAGNOSIS — K219 Gastro-esophageal reflux disease without esophagitis: Secondary | ICD-10-CM | POA: Diagnosis not present

## 2015-05-18 DIAGNOSIS — M1990 Unspecified osteoarthritis, unspecified site: Secondary | ICD-10-CM | POA: Diagnosis not present

## 2015-05-18 DIAGNOSIS — Z7982 Long term (current) use of aspirin: Secondary | ICD-10-CM | POA: Insufficient documentation

## 2015-05-18 HISTORY — PX: ESOPHAGOGASTRODUODENOSCOPY: SHX5428

## 2015-05-18 SURGERY — EGD (ESOPHAGOGASTRODUODENOSCOPY)
Anesthesia: Moderate Sedation

## 2015-05-18 MED ORDER — MIDAZOLAM HCL 5 MG/ML IJ SOLN
INTRAMUSCULAR | Status: AC
  Filled 2015-05-18: qty 1

## 2015-05-18 MED ORDER — BUTAMBEN-TETRACAINE-BENZOCAINE 2-2-14 % EX AERO
INHALATION_SPRAY | CUTANEOUS | Status: DC | PRN
  Administered 2015-05-18: 2 via TOPICAL

## 2015-05-18 MED ORDER — FENTANYL CITRATE (PF) 100 MCG/2ML IJ SOLN
INTRAMUSCULAR | Status: AC
  Filled 2015-05-18: qty 2

## 2015-05-18 MED ORDER — FENTANYL CITRATE (PF) 100 MCG/2ML IJ SOLN
INTRAMUSCULAR | Status: DC | PRN
  Administered 2015-05-18 (×3): 25 ug via INTRAVENOUS

## 2015-05-18 MED ORDER — MIDAZOLAM HCL 10 MG/2ML IJ SOLN
INTRAMUSCULAR | Status: DC | PRN
  Administered 2015-05-18 (×3): 2 mg via INTRAVENOUS
  Administered 2015-05-18: 1 mg via INTRAVENOUS

## 2015-05-18 MED ORDER — SODIUM CHLORIDE 0.9 % IV SOLN: INTRAVENOUS | Status: DC

## 2015-05-18 NOTE — Op Note (Addendum)
05/18/2015  3:49 PM  PATIENT:  Beth Wright, 59 y.o., female, MRN: [id number]  PREOP DIAGNOSIS:  Reflux symptoms, lap band  POSTOP DIAGNOSIS:   Normal lap band anatomy  PROCEDURE:  Esophagogastroduedonoscopy  SURGEON:   Alphonsa Overall, M.D.  ANESTHESIA:   Fentanyl  75 mcg   Versed 7 mg  INDICATIONS FOR PROCEDURE:  Beth Wright is a 59 y.o. (DOB: 1956/07/17)  white  female whose primary care physician is Wayland Salinas, MD and comes for upper endoscopy to evaluate reflux disease   She had a lap band placed originally in [place] in Jan 2008.  Dr. Hassell Done replaced this lap band with a AP Large Lap Band in Dec 2011.  She did well early, but has struggled recently with reflux.   The indications and risks of the endoscopy were explained to the patient.  The risks include, but are not limited to, perforation, bleeding, or injury to the bowel.  PROCEDURE:  The patient was monitored with a pulse oximetry, BP cuff, and EKG.  The patient has nasal O2 flowing during the procedure.   The back of the throat was anesthestized with Ceticaine.  A flexible Pentax endoscope was passed down the throat without difficulty.  Findings include:   Esophagus:   Normal   GE junction at:  35 cm   Lap band:  Normal.  Lap band channel is at 39 cm.  Imprint of lap band is normal.   Stomach: Normal.  I obtain a CLO test for H. Pylori.   Duodenum:   Normal  PLAN:    Follow up with Dr. Hassell Done in 3 to 4 weeks.  I wonder if her frequent flying is making the lap band adjustments difficult to maintian.  Alphonsa Overall, MD, [facility] Pager: [phone] Office phone:  [phone]

## 2015-05-18 NOTE — Discharge Instructions (Signed)
Esophagogastroduodenoscopy °Care After °Refer to this sheet in the next few weeks. These instructions provide you with information on caring for yourself after your procedure. Your caregiver may also give you more specific instructions. Your treatment has been planned according to current medical practices, but problems sometimes occur. Call your caregiver if you have any problems or questions after your procedure.  °HOME CARE INSTRUCTIONS °· Do not eat or drink anything until the numbing medicine (local anesthetic) has worn off and your gag reflex has returned. You will know that the local anesthetic has worn off when you can swallow comfortably. °· Do not drive for 12 hours after the procedure or as directed by your caregiver. °· Only take medicines as directed by your caregiver. °SEEK MEDICAL CARE IF:  °· You cannot stop coughing. °· You are not urinating at all or less than usual. °SEEK IMMEDIATE MEDICAL CARE IF: °· You have difficulty swallowing. °· You cannot eat or drink. °· You have worsening throat or chest pain. °· You have dizziness, lightheadedness, or you faint. °· You have nausea or vomiting. °· You have chills. °· You have a fever. °· You have severe abdominal pain. °· You have black, tarry, or bloody stools. °Document Released: 10/30/2012 Document Reviewed: 10/30/2012 °ExitCare® Patient Information ©2015 [employer]. This information is not intended to replace advice given to you by your health care provider. Make sure you discuss any questions you have with your health care provider. ° °

## 2015-05-19 ENCOUNTER — Encounter (HOSPITAL_COMMUNITY): Payer: Self-pay | Admitting: Surgery

## 2015-05-19 LAB — CLOTEST (H. PYLORI), BIOPSY: Helicobacter screen: POSITIVE — AB

## 2015-12-08 DIAGNOSIS — N393 Stress incontinence (female) (male): Secondary | ICD-10-CM

## 2015-12-08 DIAGNOSIS — D219 Benign neoplasm of connective and other soft tissue, unspecified: Secondary | ICD-10-CM

## 2015-12-08 HISTORY — DX: Stress incontinence (female) (male): N39.3

## 2015-12-08 HISTORY — DX: Benign neoplasm of connective and other soft tissue, unspecified: D21.9

## 2016-07-02 DIAGNOSIS — H43812 Vitreous degeneration, left eye: Secondary | ICD-10-CM

## 2016-07-02 DIAGNOSIS — Z961 Presence of intraocular lens: Secondary | ICD-10-CM

## 2016-07-02 HISTORY — DX: Vitreous degeneration, left eye: H43.812

## 2016-07-02 HISTORY — DX: Presence of intraocular lens: Z96.1

## 2016-12-27 ENCOUNTER — Other Ambulatory Visit (HOSPITAL_COMMUNITY): Payer: Self-pay | Admitting: Surgery

## 2016-12-27 DIAGNOSIS — Z9884 Bariatric surgery status: Secondary | ICD-10-CM

## 2017-01-04 ENCOUNTER — Ambulatory Visit (HOSPITAL_COMMUNITY)
Admission: RE | Admit: 2017-01-04 | Discharge: 2017-01-04 | Disposition: A | Discharge status: Home / Self Care | Payer: BLUE CROSS/BLUE SHIELD | Source: Ambulatory Visit | Attending: Surgery | Admitting: Surgery

## 2017-01-04 ENCOUNTER — Other Ambulatory Visit (HOSPITAL_COMMUNITY): Payer: Self-pay | Admitting: Surgery

## 2017-01-04 DIAGNOSIS — Z9884 Bariatric surgery status: Secondary | ICD-10-CM | POA: Diagnosis not present

## 2017-01-04 DIAGNOSIS — K224 Dyskinesia of esophagus: Secondary | ICD-10-CM | POA: Diagnosis not present

## 2017-01-04 DIAGNOSIS — K219 Gastro-esophageal reflux disease without esophagitis: Secondary | ICD-10-CM | POA: Diagnosis not present

## 2017-01-05 ENCOUNTER — Ambulatory Visit (HOSPITAL_COMMUNITY): Admission: RE | Admit: 2017-01-05 | Payer: BLUE CROSS/BLUE SHIELD | Source: Ambulatory Visit

## 2017-01-05 ENCOUNTER — Ambulatory Visit (HOSPITAL_COMMUNITY): Payer: BLUE CROSS/BLUE SHIELD

## 2017-05-01 ENCOUNTER — Ambulatory Visit: Payer: Self-pay | Admitting: Surgery

## 2017-05-07 ENCOUNTER — Encounter (HOSPITAL_COMMUNITY)
Admission: RE | Disposition: A | Discharge status: Home / Self Care | Payer: Self-pay | Source: Ambulatory Visit | Attending: Surgery

## 2017-05-07 ENCOUNTER — Encounter (HOSPITAL_COMMUNITY): Payer: Self-pay | Admitting: *Deleted

## 2017-05-07 ENCOUNTER — Ambulatory Visit (HOSPITAL_COMMUNITY): Payer: BLUE CROSS/BLUE SHIELD | Admitting: Anesthesiology

## 2017-05-07 ENCOUNTER — Ambulatory Visit (HOSPITAL_COMMUNITY)
Admission: RE | Admit: 2017-05-07 | Discharge: 2017-05-07 | Disposition: A | Discharge status: Home / Self Care | Payer: BLUE CROSS/BLUE SHIELD | Source: Ambulatory Visit | Attending: Surgery | Admitting: Surgery

## 2017-05-07 DIAGNOSIS — Z7982 Long term (current) use of aspirin: Secondary | ICD-10-CM | POA: Insufficient documentation

## 2017-05-07 DIAGNOSIS — T85848A Pain due to other internal prosthetic devices, implants and grafts, initial encounter: Secondary | ICD-10-CM | POA: Insufficient documentation

## 2017-05-07 DIAGNOSIS — Z79890 Hormone replacement therapy (postmenopausal): Secondary | ICD-10-CM | POA: Insufficient documentation

## 2017-05-07 DIAGNOSIS — Z79810 Long term (current) use of selective estrogen receptor modulators (SERMs): Secondary | ICD-10-CM | POA: Diagnosis not present

## 2017-05-07 DIAGNOSIS — Z79899 Other long term (current) drug therapy: Secondary | ICD-10-CM | POA: Insufficient documentation

## 2017-05-07 DIAGNOSIS — K219 Gastro-esophageal reflux disease without esophagitis: Secondary | ICD-10-CM | POA: Diagnosis not present

## 2017-05-07 DIAGNOSIS — Y831 Surgical operation with implant of artificial internal device as the cause of abnormal reaction of the patient, or of later complication, without mention of misadventure at the time of the procedure: Secondary | ICD-10-CM | POA: Insufficient documentation

## 2017-05-07 HISTORY — PX: ESOPHAGOGASTRODUODENOSCOPY (EGD) WITH PROPOFOL: SHX5813

## 2017-05-07 SURGERY — ESOPHAGOGASTRODUODENOSCOPY (EGD) WITH PROPOFOL
Anesthesia: Monitor Anesthesia Care

## 2017-05-07 MED ORDER — CHLORHEXIDINE GLUCONATE CLOTH 2 % EX PADS: 6.0000 | MEDICATED_PAD | Freq: Once | CUTANEOUS | Status: DC

## 2017-05-07 MED ORDER — PROPOFOL 10 MG/ML IV BOLUS
INTRAVENOUS | Status: DC | PRN
  Administered 2017-05-07 (×5): 20 mg via INTRAVENOUS

## 2017-05-07 MED ORDER — PROPOFOL 500 MG/50ML IV EMUL
INTRAVENOUS | Status: DC | PRN
  Administered 2017-05-07: 140 ug/kg/min via INTRAVENOUS

## 2017-05-07 MED ORDER — LIDOCAINE 2% (20 MG/ML) 5 ML SYRINGE
INTRAMUSCULAR | Status: AC
  Filled 2017-05-07: qty 5

## 2017-05-07 MED ORDER — PROPOFOL 10 MG/ML IV BOLUS
INTRAVENOUS | Status: AC
  Filled 2017-05-07: qty 60

## 2017-05-07 MED ORDER — LIDOCAINE 2% (20 MG/ML) 5 ML SYRINGE
INTRAMUSCULAR | Status: DC | PRN
  Administered 2017-05-07: 100 mg via INTRAVENOUS

## 2017-05-07 MED ORDER — LACTATED RINGERS IV SOLN
INTRAVENOUS | Status: DC
  Administered 2017-05-07: 1000 mL via INTRAVENOUS

## 2017-05-07 NOTE — H&P (Signed)
Chief Complaint:  Pain in lapband port especially when flying  History of Present Illness:  Beth Wright is an 62 y.o. female who has a Lapband and has done well until earlier this year when she began having more pain in her port.  No infection.  UGI unremarkable.  Will check upper endoscopy to look at band placement and to rule out erosion  Past Medical History:  Diagnosis Date  . Abdominal pain, epigastric   . Arthritis   . Chronic bronchitis   . Fatigue   . Generalized headaches   . GERD (gastroesophageal reflux disease)   . Hernia   . Trouble swallowing     Past Surgical History:  Procedure Laterality Date  . ESOPHAGOGASTRODUODENOSCOPY N/A 05/18/2015   Procedure: ESOPHAGOGASTRODUODENOSCOPY (EGD);  Surgeon: Alphonsa Overall, MD;  Location: [facility];  Service: General;  Laterality: N/A;  . LAPAROSCOPIC GASTRIC BANDING  10/2010   replacement of original  . MENISCUS REPAIR  2005  . SPINAL FUSION     c4-c7 w/ cadaver bone  . TMJ ARTHROPLASTY  90/92    Current Facility-Administered Medications  Medication Dose Route Frequency Provider Last Rate Last Dose  . Chlorhexidine Gluconate Cloth 2 % PADS 6 each  6 each Topical Once Johnathan Hausen, MD       And  . Chlorhexidine Gluconate Cloth 2 % PADS 6 each  6 each Topical Once Johnathan Hausen, MD      . lactated ringers infusion   Intravenous Continuous Johnathan Hausen, MD 10 mL/hr at 05/07/17 1029 1,000 mL at 05/07/17 1029   Patient has no known allergies. Family History  Problem Relation Age of Onset  . Cancer Mother        melanoma  . Stroke Father   . Acute myelogenous leukemia Sister    Social History:   reports that she has never smoked. She has never used smokeless tobacco. She reports that she drinks alcohol. She reports that she does not use drugs.   REVIEW OF SYSTEMS : Negative except for see problem list  Physical Exam:   Blood pressure (!) 146/62, temperature 98.5 F (36.9 C), temperature source Oral,  resp. rate 12, height 5\' 6"  (1.676 m), weight 68.5 kg (151 lb), last menstrual period 05/18/2015, SpO2 100 %. Body mass index is 24.37 kg/m.  Gen:  WDWN WF NAD  Neurological: Alert and oriented to person, place, and time. Motor and sensory function is grossly intact  Head: Normocephalic and atraumatic.  Eyes: Conjunctivae are normal. Pupils are equal, round, and reactive to light. No scleral icterus.  Neck: Normal range of motion. Neck supple. No tracheal deviation or thyromegaly present.  Cardiovascular:  SR without murmurs or gallops.  No carotid bruits Breast:  Not examined Respiratory: Effort normal.  No respiratory distress. No chest wall tenderness. Breath sounds normal.  No wheezes, rales or rhonchi.  Abdomen:  Non tender with port in left upper quadrant GU:  neg Musculoskeletal: Normal range of motion. Extremities are nontender. No cyanosis, edema or clubbing noted Lymphadenopathy: No cervical, preauricular, postauricular or axillary adenopathy is present Skin: Skin is warm and dry. No rash noted. No diaphoresis. No erythema. No pallor. Pscyh: Normal mood and affect. Behavior is normal. Judgment and thought content normal.   LABORATORY RESULTS: No results found for this or any previous visit (from the past 48 hour(s)).   RADIOLOGY RESULTS: No results found.  Problem List: Patient Active Problem List   Diagnosis Date Noted  . Fitting and adjustment of gastric  lap band 03/07/2013  . 10 cm Lapband [place]-changed out to APL in 2011 03/07/2013    Assessment & Plan: lapband port pain-upper endoscopy    Matt B. Hassell Done, MD, [facility] [phone] beeper [phone]  05/07/2017 10:59 AM

## 2017-05-07 NOTE — Anesthesia Postprocedure Evaluation (Signed)
Anesthesia Post Note  Patient: Beth Wright  Procedure(s) Performed: Procedure(s) (LRB): ESOPHAGOGASTRODUODENOSCOPY (EGD) WITH PROPOFOL (N/A)     Patient location during evaluation: Endoscopy Anesthesia Type: MAC Level of consciousness: awake and alert Pain management: pain level controlled Vital Signs Assessment: post-procedure vital signs reviewed and stable Respiratory status: spontaneous breathing and respiratory function stable Cardiovascular status: stable Anesthetic complications: no    Last Vitals:  Vitals:   05/07/17 1130 05/07/17 1142  BP: 119/69 129/84  Pulse: 67 (!) 57  Resp: 13 (!) 9  Temp:      Last Pain:  Vitals:   05/07/17 1013  TempSrc: Oral                 Savannah Erbe DANIEL

## 2017-05-07 NOTE — Discharge Instructions (Signed)
Esophagogastroduodenoscopy, Care After °Refer to this sheet in the next few weeks. These instructions provide you with information about caring for yourself after your procedure. Your health care provider may also give you more specific instructions. Your treatment has been planned according to current medical practices, but problems sometimes occur. Call your health care provider if you have any problems or questions after your procedure. °What can I expect after the procedure? °After the procedure, it is common to have: °· A sore throat. °· Nausea. °· Bloating. °· Dizziness. °· Fatigue. ° °Follow these instructions at home: °· Do not eat or drink anything until the numbing medicine (local anesthetic) has worn off and your gag reflex has returned. You will know that the local anesthetic has worn off when you can swallow comfortably. °· Do not drive for 24 hours if you received a medicine to help you relax (sedative). °· If your health care provider took a tissue sample for testing during the procedure, make sure to get your test results. This is your responsibility. Ask your health care provider or the department performing the test when your results will be ready. °· Keep all follow-up visits as told by your health care provider. This is important. °Contact a health care provider if: °· You cannot stop coughing. °· You are not urinating. °· You are urinating less than usual. °Get help right away if: °· You have trouble swallowing. °· You cannot eat or drink. °· You have throat or chest pain that gets worse. °· You are dizzy or light-headed. °· You faint. °· You have nausea or vomiting. °· You have chills. °· You have a fever. °· You have severe abdominal pain. °· You have black, tarry, or bloody stools. °This information is not intended to replace advice given to you by your health care provider. Make sure you discuss any questions you have with your health care provider. °Document Released: 10/30/2012 Document  Revised: 04/20/2016 Document Reviewed: 10/07/2015 °Elsevier Interactive Patient Education © 2018 Elsevier Inc. ° °

## 2017-05-07 NOTE — Transfer of Care (Signed)
Immediate Anesthesia Transfer of Care Note  Patient: Beth Wright  Procedure(s) Performed: Procedure(s): ESOPHAGOGASTRODUODENOSCOPY (EGD) WITH PROPOFOL (N/A)  Patient Location: Endoscopy Unit  Anesthesia Type:MAC  Level of Consciousness: awake, alert  and oriented  Airway & Oxygen Therapy: Patient Spontanous Breathing  Post-op Assessment: Report given to RN and Post -op Vital signs reviewed and stable  Post vital signs: Reviewed and stable  Last Vitals:  Vitals:   05/07/17 1013  BP: (!) 146/62  Resp: 12  Temp: 36.9 C    Last Pain:  Vitals:   05/07/17 1013  TempSrc: Oral         Complications: No apparent anesthesia complications

## 2017-05-07 NOTE — Op Note (Signed)
Surgeon: Kaylyn Lim, MD, FACS  Asst:  Alphonsa Overall, MD, FACS  Anes:  MAC  Preop Dx: Pain in lapband port Postop Dx: same  Procedure: EGD Location Surgery: [facility] Complications: none  EBL:   none cc  Drains: none  Description of Procedure:  The patient was taken to OR 1 .  After anesthesia was administered and the patient was prepped a timeout was performed.  Endoscope was passed easily into the esophagus.  The EG junction (40 cm) was clear and no hiatal hernia was seen.  The scope was passed into the duodenum and she had a normal appearing antrum and duodenum.  Retroflexion showed the band in good position and there was no evidence of an erosion.  The EG junction was reexamined and no evidence of a hiatal hernia.  No cause for port pain seen.    The patient tolerated the procedure well and was taken to the PACU in stable condition.     Matt B. Hassell Done, [place], [facility], [place]

## 2017-05-07 NOTE — Anesthesia Preprocedure Evaluation (Addendum)
Anesthesia Evaluation  Patient identified by MRN, date of birth, ID band Patient awake    Reviewed: Allergy & Precautions, NPO status , Patient's Chart, lab work & pertinent test results  History of Anesthesia Complications Negative for: history of anesthetic complications  Airway Mallampati: II  TM Distance: >3 FB Neck ROM: Full    Dental no notable dental hx. (+) Dental Advisory Given   Pulmonary neg pulmonary ROS,    Pulmonary exam normal        Cardiovascular negative cardio ROS Normal cardiovascular exam     Neuro/Psych negative neurological ROS  negative psych ROS   GI/Hepatic Neg liver ROS, GERD  ,  Endo/Other  negative endocrine ROS  Renal/GU negative Renal ROS  negative genitourinary   Musculoskeletal negative musculoskeletal ROS (+)   Abdominal   Peds negative pediatric ROS (+)  Hematology negative hematology ROS (+)   Anesthesia Other Findings   Reproductive/Obstetrics negative OB ROS                            Anesthesia Physical Anesthesia Plan  ASA: II  Anesthesia Plan: MAC   Post-op Pain Management:    Induction:   PONV Risk Score and Plan: 2 and Ondansetron and Dexamethasone  Airway Management Planned:   Additional Equipment:   Intra-op Plan:   Post-operative Plan:   Informed Consent: I have reviewed the patients History and Physical, chart, labs and discussed the procedure including the risks, benefits and alternatives for the proposed anesthesia with the patient or authorized representative who has indicated his/her understanding and acceptance.   Dental advisory given  Plan Discussed with: CRNA and Anesthesiologist  Anesthesia Plan Comments:        Anesthesia Quick Evaluation

## 2017-05-08 ENCOUNTER — Encounter (HOSPITAL_COMMUNITY): Payer: Self-pay | Admitting: Surgery

## 2017-05-17 DIAGNOSIS — L501 Idiopathic urticaria: Secondary | ICD-10-CM

## 2017-05-17 HISTORY — DX: Idiopathic urticaria: L50.1

## 2017-11-29 ENCOUNTER — Other Ambulatory Visit: Payer: Self-pay

## 2017-11-29 DIAGNOSIS — M1990 Unspecified osteoarthritis, unspecified site: Secondary | ICD-10-CM | POA: Insufficient documentation

## 2017-11-29 DIAGNOSIS — I499 Cardiac arrhythmia, unspecified: Secondary | ICD-10-CM

## 2017-11-29 DIAGNOSIS — K219 Gastro-esophageal reflux disease without esophagitis: Secondary | ICD-10-CM | POA: Insufficient documentation

## 2017-11-29 DIAGNOSIS — E042 Nontoxic multinodular goiter: Secondary | ICD-10-CM

## 2017-11-29 HISTORY — DX: Cardiac arrhythmia, unspecified: I49.9

## 2017-11-29 HISTORY — DX: Nontoxic multinodular goiter: E04.2

## 2017-12-03 ENCOUNTER — Other Ambulatory Visit: Payer: Self-pay

## 2017-12-04 ENCOUNTER — Ambulatory Visit: Payer: BLUE CROSS/BLUE SHIELD | Admitting: Cardiology

## 2017-12-04 ENCOUNTER — Encounter: Payer: Self-pay | Admitting: Cardiology

## 2017-12-04 VITALS — BP 136/72 | HR 75 | Ht 65.0 in

## 2017-12-04 DIAGNOSIS — R002 Palpitations: Secondary | ICD-10-CM

## 2017-12-04 DIAGNOSIS — R0789 Other chest pain: Secondary | ICD-10-CM

## 2017-12-04 HISTORY — DX: Other chest pain: R07.89

## 2017-12-04 NOTE — Patient Instructions (Signed)
Medication Instructions:  Your physician recommends that you continue on your current medications as directed. Please refer to the Current Medication list given to you today.  Labwork: None  Testing/Procedures: Your physician has requested that you have an echocardiogram. Echocardiography is a painless test that uses sound waves to create images of your heart. It provides your doctor with information about the size and shape of your heart and how well your heart's chambers and valves are working. This procedure takes approximately one hour. There are no restrictions for this procedure.  Your physician has requested that you have a stress echocardiogram. For further information please visit [email or web address]. Please follow instruction sheet as given.  Your physician has recommended that you wear an event monitor. Event monitors are medical devices that record the heart's electrical activity. Doctors most often [place] these monitors to diagnose arrhythmias. Arrhythmias are problems with the speed or rhythm of the heartbeat. The monitor is a small, portable device. You can wear one while you do your normal daily activities. This is usually used to diagnose what is causing palpitations/syncope (passing out).  Follow-Up: Your physician recommends that you schedule a follow-up appointment in: 1 month  Any Other Special Instructions Will Be Listed Below (If Applicable).  Echocardiogram An echocardiogram, or echocardiography, uses sound waves (ultrasound) to produce an image of your heart. The echocardiogram is simple, painless, obtained within a short period of time, and offers valuable information to your health care provider. The images from an echocardiogram can provide information such as:  Evidence of coronary artery disease (CAD).  Heart size.  Heart muscle function.  Heart valve function.  Aneurysm detection.  Evidence of a past heart attack.  Fluid buildup around the heart.  Heart  muscle thickening.  Assess heart valve function.  Tell a health care provider about:  Any allergies you have.  All medicines you are taking, including vitamins, herbs, eye drops, creams, and over-the-counter medicines.  Any problems you or family members have had with anesthetic medicines.  Any blood disorders you have.  Any surgeries you have had.  Any medical conditions you have.  Whether you are pregnant or may be pregnant. What happens before the procedure? No special preparation is needed. Eat and drink normally. What happens during the procedure?  In order to produce an image of your heart, gel will be applied to your chest and a wand-like tool (transducer) will be moved over your chest. The gel will help transmit the sound waves from the transducer. The sound waves will harmlessly bounce off your heart to allow the heart images to be captured in real-time motion. These images will then be recorded.  You may need an IV to receive a medicine that improves the quality of the pictures. What happens after the procedure? You may return to your normal schedule including diet, activities, and medicines, unless your health care provider tells you otherwise. This information is not intended to replace advice given to you by your health care provider. Make sure you discuss any questions you have with your health care provider. Document Released: 11/10/2000 Document Revised: 07/01/2016 Document Reviewed: 07/21/2013 Elsevier Interactive Patient Education  2017 [employer].  Exercise Stress Echocardiogram An exercise stress echocardiogram is a test to check how well your heart is working. This test uses sound waves (ultrasound) and a computer to make images of your heart before and after exercise. Ultrasound images that are taken before you exercise (your resting echocardiogram) will show how much blood is getting to your  heart muscle and how well your heart muscle and heart valves are  functioning. During the next part of this test, you will walk on a treadmill or ride a stationary bike to see how exercise affects your heart. While you exercise, the electrical activity of your heart will be monitored with an electrocardiogram (ECG). Your blood pressure will also be monitored. You may have this test if you:  Have chest pain or other symptoms of a heart problem.  Recently had a heart attack or heart surgery.  Have heart valve problems.  Have a condition that causes narrowing of the blood vessels that supply your heart (coronary artery disease).  Have a high risk of heart disease and are starting a new exercise program.  Have a high risk of heart disease and need to have major surgery.  Tell a health care provider about:  Any allergies you have.  All medicines you are taking, including vitamins, herbs, eye drops, creams, and over-the-counter medicines.  Any problems you or family members have had with anesthetic medicines.  Any blood disorders you have.  Any surgeries you have had.  Any medical conditions you have.  Whether you are pregnant or may be pregnant. What are the risks? Generally, this is a safe procedure. However, problems may occur, including:  Chest pain.  Dizziness or light-headedness.  Shortness of breath.  Increased or irregular heartbeat (palpitations).  Nausea or vomiting.  Heart attack (very rare).  What happens before the procedure?  Follow instructions from your health care provider about eating or drinking restrictions. You may be asked to avoid all forms of caffeine for 24 hours before your procedure, or as told by your health care provider.  Ask your health care provider about changing or stopping your regular medicines. This is especially important if you are taking diabetes medicines or blood thinners.  If you use an inhaler, bring it with you to the test.  Wear loose, comfortable clothing and walking shoes.  Do notuse  any products that contain nicotine or tobacco, such as cigarettes and e-cigarettes, for 4 hours before the test or as told by your health care provider. If you need help quitting, ask your health care provider. What happens during the procedure?  You will take off your clothes from the waist up and put on a hospital gown.  A technician will place electrodes on your chest.  A blood pressure cuff will be placed on your arm.  You will lie down on a table for an ultrasound exam before you exercise. Gel will be rubbed on your chest, and a handheld device (transducer) will be pressed against your chest and moved over your heart.  Then, you will start exercising by walking on a treadmill or pedaling a stationary bicycle.  Your blood pressure and heart rhythm will be monitored while you exercise.  The exercise will gradually get harder or faster.  You will exercise until: ? Your heart reaches a target level. ? You are too tired to continue. ? You cannot continue because of chest pain, weakness, or dizziness.  You will have another ultrasound exam after you stop exercising. The procedure may vary among health care providers and hospitals. What happens after the procedure?  Your heart rate and blood pressure will be monitored until they return to your normal levels. Summary  An exercise stress echocardiogram is a test that uses ultrasound to check how well your heart works before and after exercise.  Before the test, follow instructions from your  health care provider about stopping medications, avoiding nicotine and tobacco, and avoiding certain foods and drinks.  During the test, your blood pressure and heart rhythm will be monitored while you exercise on a treadmill or stationary bicycle. This information is not intended to replace advice given to you by your health care provider. Make sure you discuss any questions you have with your health care provider. Document Released: 11/17/2004  Document Revised: 07/05/2016 Document Reviewed: 07/05/2016 Elsevier Interactive Patient Education  2018 [employer].  Cardiac Event Monitoring A cardiac event monitor is a small recording device that is used to detect abnormal heart rhythms (arrhythmias). The monitor is used to record your heart rhythm when you have symptoms, such as:  Fast heartbeats (palpitations), such as heart racing or fluttering.  Dizziness.  Fainting or light-headedness.  Unexplained weakness.  Some monitors are wired to electrodes placed on your chest. Electrodes are flat, sticky disks that attach to your skin. Other monitors may be hand-held or worn on the wrist. The monitor can be worn for up to 30 days. If the monitor is attached to your chest, a technician will prepare your chest for the electrode placement and show you how to work the monitor. Take time to practice using the monitor before you leave the office. Make sure you understand how to send the information from the monitor to your health care provider. In some cases, you may need to use a landline telephone instead of a cell phone. What are the risks? Generally, this device is safe to use, but it possible that the skin under the electrodes will become irritated. How to use your cardiac event monitor  Wear your monitor at all times, except when you are in water: ? Do not let the monitor get wet. ? Take the monitor off when you bathe. Do not swim or use a hot tub with it on.  Keep your skin clean. Do not put body lotion or moisturizer on your chest.  Change the electrodes as told by your health care provider or any time they stop sticking to your skin. You may need to use medical tape to keep them on.  Try to put the electrodes in slightly different places on your chest to help prevent skin irritation. They must remain in the area under your left breast and in the upper right section of your chest.  Make sure the monitor is safely clipped to your  clothing or in a location close to your body that your health care provider recommends.  Press the button to record as soon as you feel heart-related symptoms, such as: ? Dizziness. ? Weakness. ? Light-headedness. ? Palpitations. ? Thumping or pounding in your chest. ? Shortness of breath. ? Unexplained weakness.  Keep a diary of your activities, such as walking, doing chores, and taking medicine. It is very important to note what you were doing when you pushed the button to record your symptoms. This will help your health care provider determine what might be contributing to your symptoms.  Send the recorded information as recommended by your health care provider. It may take some time for your health care provider to process the results.  Change the batteries as told by your health care provider.  Keep electronic devices away from your monitor. This includes: ? Tablets. ? MP3 players. ? Cell phones.  While wearing your monitor you should avoid: ? Electric blankets. ? Armed forces operational officer. ? Electric toothbrushes. ? Microwave ovens. ? Magnets. ? Metal detectors. Get help  right away if:  You have chest pain.  You have extreme difficulty breathing or shortness of breath.  You develop a very fast heartbeat that persists.  You develop dizziness that does not go away.  You faint or constantly feel like you are about to faint. Summary  A cardiac event monitor is a small recording device that is used to help detect abnormal heart rhythms (arrhythmias).  The monitor is used to record your heart rhythm when you have heart-related symptoms.  Make sure you understand how to send the information from the monitor to your health care provider.  It is important to press the button on the monitor when you have any heart-related symptoms.  Keep a diary of your activities, such as walking, doing chores, and taking medicine. It is very important to note what you were doing when you pushed  the button to record your symptoms. This will help your health care provider learn what might be causing your symptoms. This information is not intended to replace advice given to you by your health care provider. Make sure you discuss any questions you have with your health care provider. Document Released: 08/22/2008 Document Revised: 10/28/2016 Document Reviewed: 10/28/2016 Elsevier Interactive Patient Education  2017 [employer].    If you need a refill on your cardiac medications before your next appointment, please call your pharmacy.   [place], RN, BSN

## 2017-12-04 NOTE — Progress Notes (Signed)
Cardiology Office Note:    Date:  12/04/2017   ID:  Beth Wright, DOB 1956/06/27, MRN [id number]  PCP:  Wayland Salinas, MD  Cardiologist:  Jenean Lindau, MD   Referring MD: Wayland Salinas, *    ASSESSMENT:    1. Palpitations   2. Chest pain, atypical    PLAN:    In order of problems listed above:  1. I discussed my findings of palpitations.  Patient will undergo one month event monitoring.  Her thyroid testing was unremarkable.  I reviewed lab work done about the few days ago. 2. Echocardiogram will be done to assess murmur heard on auscultation. 3. No chest pain symptoms are atypical however in view of the elevated blood pressure and elevated lipids we will do an exercise stress echo to rule out any obstructive coronary artery disease. 4. She knows to go to the nearest emergency room for any concerning symptoms.  She will be seen in follow-up appointment in 1 month or earlier if she has any concerns.   Medication Adjustments/Labs and Tests Ordered: Current medicines are reviewed at length with the patient today.  Concerns regarding medicines are outlined above.  Orders Placed This Encounter  Procedures  . Cardiac event monitor  . EKG 12-Lead  . ECHOCARDIOGRAM COMPLETE  . ECHOCARDIOGRAM STRESS TEST   No orders of the defined types were placed in this encounter.    History of Present Illness:    Beth Wright is a 62 y.o. female who is being seen today for the evaluation of palpitations and chest pain at the request of Ryter-Brown, Shyrl Numbers, *.  Patient is a pleasant 62 year old female.  She has past medical history of essential hypertension.  She mentions to me that she takes the hydrochlorothiazide for this.  She has been concerned because of palpitations occurring on and off but this is what she is here for to be evaluated.  She walks him on a regular basis but feels that her exercise is inadequate.  She occasionally has substernal or  rather a stab-like sensation.  This occurs unrelated to exertion and no radiation of the symptoms to any part of the body.  No orthopnea or PND.  No syncope.  At the time of my evaluation, the patient is alert awake oriented and in no distress.  Past Medical History:  Diagnosis Date  . Abdominal pain, epigastric   . Arthritis   . Chronic bronchitis   . Fatigue   . Generalized headaches   . GERD (gastroesophageal reflux disease)   . Hernia   . Trouble swallowing     Past Surgical History:  Procedure Laterality Date  . ESOPHAGOGASTRODUODENOSCOPY N/A 05/18/2015   Procedure: ESOPHAGOGASTRODUODENOSCOPY (EGD);  Surgeon: Alphonsa Overall, MD;  Location: [facility];  Service: General;  Laterality: N/A;  . ESOPHAGOGASTRODUODENOSCOPY (EGD) WITH PROPOFOL N/A 05/07/2017   Procedure: ESOPHAGOGASTRODUODENOSCOPY (EGD) WITH PROPOFOL;  Surgeon: Johnathan Hausen, MD;  Location: [facility];  Service: General;  Laterality: N/A;  . LAPAROSCOPIC GASTRIC BANDING  10/2010   replacement of original  . MENISCUS REPAIR  2005  . SPINAL FUSION     c4-c7 w/ cadaver bone  . TMJ ARTHROPLASTY  90/92    Current Medications: Current Meds  Medication Sig  . ALPRAZolam (XANAX) 1 MG tablet Take 0.5 mg by mouth daily as needed for anxiety.  Marland Kitchen aspirin 81 MG tablet Take 81 mg by mouth daily.  . cetirizine (ZYRTEC) 10 MG tablet Take 10 mg by mouth daily.  Marland Kitchen  Cholecalciferol (VITAMIN D3) 3000 units TABS Take by mouth.  . Docosahexaenoic Acid 200 MG CAPS Take 3,200 mg by mouth daily.  . hydrochlorothiazide (HYDRODIURIL) 25 MG tablet Take by mouth.  . LUTEIN PO Take by mouth.    . Nutritional Supplements (DHEA) 15-50 MG CAPS Take 1 tablet by mouth daily.  Marland Kitchen omalizumab (XOLAIR) 150 MG injection Inject 150 mg into the skin every 28 (twenty-eight) days.  . OSPHENA 60 MG TABS Take 60 mg by mouth daily.  Marland Kitchen OVER THE COUNTER MEDICATION Take 1,000 mcg by mouth daily. Optimized folate  . OVER THE COUNTER MEDICATION Take 370 mg by  mouth daily. Cura-Med Estrogen Balance  . OVER THE COUNTER MEDICATION Take 2 tablets by mouth daily. Mind-Max  . PHYTONADIONE PO Take by mouth. Vitamin K-1  . Pregnenolone POWD Take 50 mg by mouth daily.  . progesterone (PROMETRIUM) 200 MG capsule Take 200 mg by mouth at bedtime.  . ranitidine (ZANTAC) 150 MG tablet Take by mouth.  . Thyroid (NATURE-THROID) 113.75 MG TABS Take by mouth.  Marland Kitchen VITAMIN K PO Take 2,700 mg by mouth daily.     Allergies:   Patient has no known allergies.   Social History   Socioeconomic History  . Marital status: Married    Spouse name: None  . Number of children: None  . Years of education: None  . Highest education level: None  Social Needs  . Financial resource strain: None  . Food insecurity - worry: None  . Food insecurity - inability: None  . Transportation needs - medical: None  . Transportation needs - non-medical: None  Occupational History  . None  Tobacco Use  . Smoking status: Never Smoker  . Smokeless tobacco: Never Used  Substance and Sexual Activity  . Alcohol use: Yes    Alcohol/week: 0.0 oz    Types: 1 - 2 Glasses of wine per week    Comment: 2-3 GLASSES OF WINE PER WEEK  . Drug use: No  . Sexual activity: None  Other Topics Concern  . None  Social History Narrative  . None     Family History: The patient's family history includes Acute myelogenous leukemia in her sister; Cancer in her mother; Stroke in her father.  ROS:   Please see the history of present illness.    All other systems reviewed and are negative.  EKGs/Labs/Other Studies Reviewed:    The following studies were reviewed today: I reviewed EKG and other findings including lipids and TSH from the primary care doctor's office and discussed this with the patient at extensive length.   Recent Labs: No results found for requested labs within last 8760 hours.  Recent Lipid Panel    Component Value Date/Time   CHOL (H) 11/11/2010 1400    208        ATP  III CLASSIFICATION:  <200     mg/dL   Desirable  200-239  mg/dL   Borderline High  >=240    mg/dL   High          TRIG 98 11/11/2010 1400   HDL 64 11/11/2010 1400   CHOLHDL 3.3 11/11/2010 1400   VLDL 20 11/11/2010 1400   LDLCALC (H) 11/11/2010 1400    124        Total Cholesterol/HDL:CHD Risk Coronary Heart Disease Risk Table                     Men   Women  1/2 Average Risk  3.4   3.3  Average Risk       5.0   4.4  2 X Average Risk   9.6   7.1  3 X Average Risk  23.4   11.0        Use the calculated Patient Ratio above and the CHD Risk Table to determine the patient's CHD Risk.        ATP III CLASSIFICATION (LDL):  <100     mg/dL   Optimal  100-129  mg/dL   Near or Above                    Optimal  130-159  mg/dL   Borderline  160-189  mg/dL   High  >190     mg/dL   Very High    Physical Exam:    VS:  BP 136/72 (BP Location: Right Arm, Patient Position: Sitting, Cuff Size: Large)   Pulse 75   Ht 5\' 5"  (1.651 m)   LMP 05/18/2015 (Within Years)   SpO2 97%   BMI 25.13 kg/m     Wt Readings from Last 3 Encounters:  05/07/17 151 lb (68.5 kg)  05/18/15 162 lb (73.5 kg)  04/02/14 162 lb 12.8 oz (73.8 kg)     GEN: Patient is in no acute distress HEENT: Normal NECK: No JVD; No carotid bruits LYMPHATICS: No lymphadenopathy CARDIAC: S1 S2 regular, 2/6 systolic murmur at the apex. RESPIRATORY:  Clear to auscultation without rales, wheezing or rhonchi  ABDOMEN: Soft, non-tender, non-distended MUSCULOSKELETAL:  No edema; No deformity  SKIN: Warm and dry NEUROLOGIC:  Alert and oriented x 3 PSYCHIATRIC:  Normal affect    Signed, Jenean Lindau, MD  12/04/2017 3:02 PM    [facility]

## 2017-12-20 ENCOUNTER — Ambulatory Visit (HOSPITAL_BASED_OUTPATIENT_CLINIC_OR_DEPARTMENT_OTHER)
Admission: RE | Admit: 2017-12-20 | Discharge: 2017-12-20 | Disposition: A | Discharge status: Home / Self Care | Payer: BLUE CROSS/BLUE SHIELD | Source: Ambulatory Visit | Attending: Cardiology | Admitting: Cardiology

## 2017-12-20 DIAGNOSIS — R002 Palpitations: Secondary | ICD-10-CM | POA: Insufficient documentation

## 2017-12-20 LAB — ECHOCARDIOGRAM COMPLETE
AV Area VTI: 2.22 cm2
AV Mean grad: 5 mmHg
AV Peak grad: 9 mmHg
AV area mean vel ind: 1.23 cm2/m2
AV peak Index: 1.26
AV vel: 2.11
AVAREAMEANV: 2.16 cm2
AVAREAVTIIND: 1.2 cm2/m2
AVCELMEANRAT: 0.69
AVPKVEL: 147 cm/s
Ao pk vel: 0.71 m/s
Ao-asc: 32 cm
CHL CUP AV VALUE AREA INDEX: 1.2
DOP CAL AO MEAN VELOCITY: 103 cm/s
EERAT: 5.61
EWDT: 197 ms
FS: 23 % — AB (ref 28–44)
IV/PV OW: 1.03
LA ID, A-P, ES: 34 mm
LA diam index: 1.93 cm/m2
LA vol A4C: 40.8 ml
LA vol index: 23.4 mL/m2
LAVOL: 41.2 mL
LEFT ATRIUM END SYS DIAM: 34 mm
LV E/e' medial: 5.61
LV E/e'average: 5.61
LV TDI E'LATERAL: 11.4
LV e' LATERAL: 11.4 cm/s
LVOT SV: 73 mL
LVOT VTI: 23.4 cm
LVOT area: 3.14 cm2
LVOT peak vel: 104 cm/s
LVOTD: 20 mm
LVOTVTI: 0.67 cm
MV Dec: 197
MV pk A vel: 73.7 m/s
MV pk E vel: 64 m/s
PW: 10.2 mm — AB (ref 0.6–1.1)
RV LATERAL S' VELOCITY: 11.7 cm/s
TAPSE: 25.9 mm
TDI e' medial: 9.03
VTI: 34.9 cm
Valve area: 2.11 cm2

## 2017-12-20 NOTE — Progress Notes (Signed)
  Echocardiogram 2D Echocardiogram has been performed.  Joelene Millin 12/20/2017, 3:56 PM

## 2017-12-21 ENCOUNTER — Ambulatory Visit: Payer: BLUE CROSS/BLUE SHIELD

## 2017-12-21 ENCOUNTER — Ambulatory Visit (HOSPITAL_BASED_OUTPATIENT_CLINIC_OR_DEPARTMENT_OTHER)
Admission: RE | Admit: 2017-12-21 | Discharge: 2017-12-21 | Disposition: A | Discharge status: Home / Self Care | Payer: BLUE CROSS/BLUE SHIELD | Source: Ambulatory Visit | Attending: Cardiology | Admitting: Cardiology

## 2017-12-21 DIAGNOSIS — R002 Palpitations: Secondary | ICD-10-CM

## 2017-12-21 NOTE — Progress Notes (Signed)
  Echocardiogram Echocardiogram Stress Test has been performed.  Beth Wright 12/21/2017, 2:31 PM

## 2018-01-25 ENCOUNTER — Ambulatory Visit: Payer: BLUE CROSS/BLUE SHIELD | Admitting: Cardiology

## 2018-01-25 ENCOUNTER — Encounter: Payer: Self-pay | Admitting: Cardiology

## 2018-01-25 VITALS — BP 118/62 | HR 84 | Ht 65.0 in

## 2018-01-25 DIAGNOSIS — R002 Palpitations: Secondary | ICD-10-CM

## 2018-01-25 NOTE — Patient Instructions (Signed)

## 2018-01-25 NOTE — Progress Notes (Signed)
Cardiology Office Note:    Date:  01/25/2018   ID:  Beth Wright, DOB [birth year], MRN [id number]  PCP:  Wayland Salinas, MD  Cardiologist:  Jenean Lindau, MD   Referring MD: Wayland Salinas, *    ASSESSMENT:    1. Palpitations    PLAN:    In order of problems listed above:  1. I reassured patient about palpitations.  She is very active and exercises on a regular basis.  Her blood pressure is stable.  Occasionally she tells me when she is anxious and stressed about work that it is elevated.  I told her that this is normal for her.  She will be seen in follow-up appointment in 6 months or earlier if she has any concerns.   Medication Adjustments/Labs and Tests Ordered: Current medicines are reviewed at length with the patient today.  Concerns regarding medicines are outlined above.  No orders of the defined types were placed in this encounter.  No orders of the defined types were placed in this encounter.    Chief Complaint  Patient presents with  . Follow-up  . Palpitations     History of Present Illness:    Beth Wright is a 62 y.o. female.  The patient was evaluated by me for palpitations and chest tightness.  She has done very well with her exercise and she feels much better.  She does not have any more palpitations blood pressure is stable.  No chest pain orthopnea or PND.  She is happy about this.  Past Medical History:  Diagnosis Date  . Abdominal pain, epigastric   . Arthritis   . Chronic bronchitis   . Fatigue   . Generalized headaches   . GERD (gastroesophageal reflux disease)   . Hernia   . Trouble swallowing     Past Surgical History:  Procedure Laterality Date  . ESOPHAGOGASTRODUODENOSCOPY N/A 05/18/2015   Procedure: ESOPHAGOGASTRODUODENOSCOPY (EGD);  Surgeon: Alphonsa Overall, MD;  Location: [facility];  Service: General;  Laterality: N/A;  . ESOPHAGOGASTRODUODENOSCOPY (EGD) WITH PROPOFOL N/A 05/07/2017   Procedure:  ESOPHAGOGASTRODUODENOSCOPY (EGD) WITH PROPOFOL;  Surgeon: Johnathan Hausen, MD;  Location: [facility];  Service: General;  Laterality: N/A;  . LAPAROSCOPIC GASTRIC BANDING  10/2010   replacement of original  . MENISCUS REPAIR  2005  . SPINAL FUSION     c4-c7 w/ cadaver bone  . TMJ ARTHROPLASTY  90/92    Current Medications: Current Meds  Medication Sig  . ALPRAZolam (XANAX) 1 MG tablet Take 0.5 mg by mouth daily as needed for anxiety.  Marland Kitchen aspirin 81 MG tablet Take 81 mg by mouth daily.  . cetirizine (ZYRTEC) 10 MG tablet Take 10 mg by mouth daily.  . Cholecalciferol (VITAMIN D3) 3000 units TABS Take by mouth.  . Docosahexaenoic Acid 200 MG CAPS Take 3,200 mg by mouth daily.  . hydrochlorothiazide (HYDRODIURIL) 25 MG tablet Take by mouth.  . LUTEIN PO Take by mouth.    . Nutritional Supplements (DHEA) 15-50 MG CAPS Take 1 tablet by mouth daily.  Marland Kitchen omalizumab (XOLAIR) 150 MG injection Inject 150 mg into the skin every 28 (twenty-eight) days.  . OSPHENA 60 MG TABS Take 60 mg by mouth daily.  Marland Kitchen OVER THE COUNTER MEDICATION Take 1,000 mcg by mouth daily. Optimized folate  . OVER THE COUNTER MEDICATION Take 370 mg by mouth daily. Cura-Med Estrogen Balance  . OVER THE COUNTER MEDICATION Take 2 tablets by mouth daily. Mind-Max  . PHYTONADIONE PO Take  by mouth. Vitamin K-1  . Pregnenolone POWD Take 50 mg by mouth daily.  . progesterone (PROMETRIUM) 200 MG capsule Take 200 mg by mouth at bedtime.  . ranitidine (ZANTAC) 150 MG tablet Take by mouth.  Marland Kitchen VITAMIN K PO Take 2,700 mg by mouth daily.  . [DISCONTINUED] estradiol (VIVELLE-DOT) 0.05 MG/24HR patch 2 (two) times a week.  . [DISCONTINUED] Thyroid (NATURE-THROID) 113.75 MG TABS Take by mouth.     Allergies:   Patient has no known allergies.   Social History   Socioeconomic History  . Marital status: Married    Spouse name: None  . Number of children: None  . Years of education: None  . Highest education level: None  Social Needs    . Financial resource strain: None  . Food insecurity - worry: None  . Food insecurity - inability: None  . Transportation needs - medical: None  . Transportation needs - non-medical: None  Occupational History  . None  Tobacco Use  . Smoking status: Never Smoker  . Smokeless tobacco: Never Used  Substance and Sexual Activity  . Alcohol use: Yes    Alcohol/week: 0.0 oz    Types: 1 - 2 Glasses of wine per week    Comment: 2-3 GLASSES OF WINE PER WEEK  . Drug use: No  . Sexual activity: None  Other Topics Concern  . None  Social History Narrative  . None     Family History: The patient's family history includes Acute myelogenous leukemia in her sister; Cancer in her mother; Stroke in her father.  ROS:   Please see the history of present illness.    All other systems reviewed and are negative.  EKGs/Labs/Other Studies Reviewed:    The following studies were reviewed today: I discussed findings of the test with the patient at extensive length.   Recent Labs: No results found for requested labs within last 8760 hours.  Recent Lipid Panel    Component Value Date/Time   CHOL (H) 11/11/2010 1400    208        ATP III CLASSIFICATION:  <200     mg/dL   Desirable  200-239  mg/dL   Borderline High  >=240    mg/dL   High          TRIG 98 11/11/2010 1400   HDL 64 11/11/2010 1400   CHOLHDL 3.3 11/11/2010 1400   VLDL 20 11/11/2010 1400   LDLCALC (H) 11/11/2010 1400    124        Total Cholesterol/HDL:CHD Risk Coronary Heart Disease Risk Table                     Men   Women  1/2 Average Risk   3.4   3.3  Average Risk       5.0   4.4  2 X Average Risk   9.6   7.1  3 X Average Risk  23.4   11.0        Use the calculated Patient Ratio above and the CHD Risk Table to determine the patient's CHD Risk.        ATP III CLASSIFICATION (LDL):  <100     mg/dL   Optimal  100-129  mg/dL   Near or Above                    Optimal  130-159  mg/dL   Borderline  160-189  mg/dL    High  >  190     mg/dL   Very High    Physical Exam:    VS:  BP 118/62 (BP Location: Left Arm, Patient Position: Sitting, Cuff Size: Normal)   Pulse 84   Ht 5\' 5"  (1.651 m)   LMP 05/18/2015 (Within Years)   SpO2 98%   BMI 25.13 kg/m     Wt Readings from Last 3 Encounters:  05/07/17 151 lb (68.5 kg)  05/18/15 162 lb (73.5 kg)  04/02/14 162 lb 12.8 oz (73.8 kg)     GEN: Patient is in no acute distress HEENT: Normal NECK: No JVD; No carotid bruits LYMPHATICS: No lymphadenopathy CARDIAC: Hear sounds regular, 2/6 systolic murmur at the apex. RESPIRATORY:  Clear to auscultation without rales, wheezing or rhonchi  ABDOMEN: Soft, non-tender, non-distended MUSCULOSKELETAL:  No edema; No deformity  SKIN: Warm and dry NEUROLOGIC:  Alert and oriented x 3 PSYCHIATRIC:  Normal affect   Signed, Jenean Lindau, MD  01/25/2018 2:43 PM    [facility]

## 2018-07-03 DIAGNOSIS — I10 Essential (primary) hypertension: Secondary | ICD-10-CM

## 2018-07-03 HISTORY — DX: Essential (primary) hypertension: I10

## 2018-10-10 ENCOUNTER — Encounter: Payer: Self-pay | Admitting: Cardiology

## 2018-10-10 ENCOUNTER — Ambulatory Visit: Payer: BLUE CROSS/BLUE SHIELD | Admitting: Cardiology

## 2018-10-10 VITALS — BP 118/62 | HR 69 | Ht 65.0 in | Wt 152.0 lb

## 2018-10-10 DIAGNOSIS — I10 Essential (primary) hypertension: Secondary | ICD-10-CM

## 2018-10-10 DIAGNOSIS — R002 Palpitations: Secondary | ICD-10-CM | POA: Diagnosis not present

## 2018-10-10 NOTE — Patient Instructions (Signed)
Medication Instructions:  Your physician recommends that you continue on your current medications as directed. Please refer to the Current Medication list given to you today.  If you need a refill on your cardiac medications before your next appointment, please call your pharmacy.   Lab work: None.  If you have labs (blood work) drawn today and your tests are completely normal, you will receive your results only by: . MyChart Message (if you have MyChart) OR . A paper copy in the mail If you have any lab test that is abnormal or we need to change your treatment, we will call you to review the results.  Testing/Procedures: None.   Follow-Up: At [facility], you and your health needs are our priority.  As part of our continuing mission to provide you with exceptional heart care, we have created designated Provider Care Teams.  These Care Teams include your primary Cardiologist (physician) and Advanced Practice Providers (APPs -  Physician Assistants and Nurse Practitioners) who all work together to provide you with the care you need, when you need it. You will need a follow up appointment in 6 months.  Please call our office 2 months in advance to schedule this appointment.  You may see No primary care provider on file. or another member of our [facility] Provider Team in [place]: Robert Krasowski, MD . Brian Munley, MD  Any Other Special Instructions Will Be Listed Below (If Applicable).     

## 2018-10-10 NOTE — Progress Notes (Signed)
Cardiology Office Note:    Date:  10/10/2018   ID:  Beth Wright, DOB 1956-06-02, MRN [id number]  PCP:  Wayland Salinas, MD  Cardiologist:  Jenean Lindau, MD   Referring MD: Wayland Salinas, *    ASSESSMENT:    1. Palpitations   2. Essential hypertension    PLAN:    In order of problems listed above:  1. Primary prevention stressed to the patient.  Importance of compliance with diet and medication stressed and she vocalized understanding.  Her blood pressure is stable.  Diet was discussed. 2. I reviewed her medications.  She will be seen in follow-up appointment in 6 months or earlier if she has any concerns.    Medication Adjustments/Labs and Tests Ordered: Current medicines are reviewed at length with the patient today.  Concerns regarding medicines are outlined above.  No orders of the defined types were placed in this encounter.  No orders of the defined types were placed in this encounter.    No chief complaint on file.    History of Present Illness:    Beth Wright is a 62 y.o. female.  Patient is a pleasant 62 year old female.  She has past medical history of palpitations and essential hypertension.  She denies any problems at this time and takes care of daily living.  No chest pain orthopnea or PND.  Palpitations are concerned and she is happy with that.  Her blood pressure is stable.  She is getting some exercise on a regular basis.  At the time of my evaluation, the patient is alert awake oriented and in no distress.  Past Medical History:  Diagnosis Date  . Abdominal pain, epigastric   . Arthritis   . Chronic bronchitis   . Fatigue   . Generalized headaches   . GERD (gastroesophageal reflux disease)   . Hernia   . Trouble swallowing     Past Surgical History:  Procedure Laterality Date  . ESOPHAGOGASTRODUODENOSCOPY N/A 05/18/2015   Procedure: ESOPHAGOGASTRODUODENOSCOPY (EGD);  Surgeon: Alphonsa Overall, MD;  Location:  [facility];  Service: General;  Laterality: N/A;  . ESOPHAGOGASTRODUODENOSCOPY (EGD) WITH PROPOFOL N/A 05/07/2017   Procedure: ESOPHAGOGASTRODUODENOSCOPY (EGD) WITH PROPOFOL;  Surgeon: Johnathan Hausen, MD;  Location: [facility];  Service: General;  Laterality: N/A;  . LAPAROSCOPIC GASTRIC BANDING  10/2010   replacement of original  . MENISCUS REPAIR  2005  . SPINAL FUSION     c4-c7 w/ cadaver bone  . TMJ ARTHROPLASTY  90/92    Current Medications: Current Meds  Medication Sig  . ALPRAZolam (XANAX) 1 MG tablet Take 0.5 mg by mouth daily as needed for anxiety.  Marland Kitchen aspirin 81 MG tablet Take 81 mg by mouth daily.  . cetirizine (ZYRTEC) 10 MG tablet Take 10 mg by mouth daily.  . Cholecalciferol (VITAMIN D3) 3000 units TABS Take by mouth.  . Docosahexaenoic Acid 200 MG CAPS Take 3,200 mg by mouth daily.  . famotidine (PEPCID) 10 MG tablet Take 10 mg by mouth daily.  . hydrochlorothiazide (HYDRODIURIL) 25 MG tablet Take by mouth.  . LUTEIN PO Take by mouth.    . Nutritional Supplements (DHEA) 15-50 MG CAPS Take 1 tablet by mouth daily.  Marland Kitchen omalizumab (XOLAIR) 150 MG injection Inject 150 mg into the skin every 28 (twenty-eight) days.  . OSPHENA 60 MG TABS Take 60 mg by mouth daily.  Marland Kitchen OVER THE COUNTER MEDICATION Take 1,000 mcg by mouth daily. Optimized folate  . OVER THE COUNTER MEDICATION  Take 370 mg by mouth daily. Cura-Med Estrogen Balance  . OVER THE COUNTER MEDICATION Take 2 tablets by mouth daily. Mind-Max  . PHYTONADIONE PO Take by mouth. Vitamin K-1  . Pregnenolone POWD Take 50 mg by mouth daily.  . progesterone (PROMETRIUM) 200 MG capsule Take 200 mg by mouth at bedtime.  Marland Kitchen VITAMIN K PO Take 2,700 mg by mouth daily.     Allergies:   Patient has no known allergies.   Social History   Socioeconomic History  . Marital status: Married    Spouse name: Not on file  . Number of children: Not on file  . Years of education: Not on file  . Highest education level: Not on file    Occupational History  . Not on file  Social Needs  . Financial resource strain: Not on file  . Food insecurity:    Worry: Not on file    Inability: Not on file  . Transportation needs:    Medical: Not on file    Non-medical: Not on file  Tobacco Use  . Smoking status: Never Smoker  . Smokeless tobacco: Never Used  Substance and Sexual Activity  . Alcohol use: Yes    Alcohol/week: 1.0 - 2.0 standard drinks    Types: 1 - 2 Glasses of wine per week    Comment: 2-3 GLASSES OF WINE PER WEEK  . Drug use: No  . Sexual activity: Not on file  Lifestyle  . Physical activity:    Days per week: Not on file    Minutes per session: Not on file  . Stress: Not on file  Relationships  . Social connections:    Talks on phone: Not on file    Gets together: Not on file    Attends religious service: Not on file    Active member of club or organization: Not on file    Attends meetings of clubs or organizations: Not on file    Relationship status: Not on file  Other Topics Concern  . Not on file  Social History Narrative  . Not on file     Family History: The patient's family history includes Acute myelogenous leukemia in her sister; Cancer in her mother; Stroke in her father.  ROS:   Please see the history of present illness.    All other systems reviewed and are negative.  EKGs/Labs/Other Studies Reviewed:    The following studies were reviewed today: I discussed my findings with the patient at extensive length   Recent Labs: No results found for requested labs within last 8760 hours.  Recent Lipid Panel    Component Value Date/Time   CHOL (H) 11/11/2010 1400    208        ATP III CLASSIFICATION:  <200     mg/dL   Desirable  200-239  mg/dL   Borderline High  >=240    mg/dL   High          TRIG 98 11/11/2010 1400   HDL 64 11/11/2010 1400   CHOLHDL 3.3 11/11/2010 1400   VLDL 20 11/11/2010 1400   LDLCALC (H) 11/11/2010 1400    124        Total Cholesterol/HDL:CHD  Risk Coronary Heart Disease Risk Table                     Men   Women  1/2 Average Risk   3.4   3.3  Average Risk  5.0   4.4  2 X Average Risk   9.6   7.1  3 X Average Risk  23.4   11.0        Use the calculated Patient Ratio above and the CHD Risk Table to determine the patient's CHD Risk.        ATP III CLASSIFICATION (LDL):  <100     mg/dL   Optimal  100-129  mg/dL   Near or Above                    Optimal  130-159  mg/dL   Borderline  160-189  mg/dL   High  >190     mg/dL   Very High    Physical Exam:    VS:  BP 118/62 (BP Location: Right Arm, Patient Position: Sitting, Cuff Size: Normal)   Pulse 69   Ht 5\' 5"  (1.651 m)   Wt 152 lb (68.9 kg)   LMP 05/18/2015 (Within Years)   SpO2 99%   BMI 25.29 kg/m     Wt Readings from Last 3 Encounters:  10/10/18 152 lb (68.9 kg)  05/07/17 151 lb (68.5 kg)  05/18/15 162 lb (73.5 kg)     GEN: Patient is in no acute distress HEENT: Normal NECK: No JVD; No carotid bruits LYMPHATICS: No lymphadenopathy CARDIAC: Hear sounds regular, 2/6 systolic murmur at the apex. RESPIRATORY:  Clear to auscultation without rales, wheezing or rhonchi  ABDOMEN: Soft, non-tender, non-distended MUSCULOSKELETAL:  No edema; No deformity  SKIN: Warm and dry NEUROLOGIC:  Alert and oriented x 3 PSYCHIATRIC:  Normal affect   Signed, Jenean Lindau, MD  10/10/2018 2:53 PM    [place]

## 2019-06-13 DIAGNOSIS — M47816 Spondylosis without myelopathy or radiculopathy, lumbar region: Secondary | ICD-10-CM | POA: Insufficient documentation

## 2019-06-13 DIAGNOSIS — M4316 Spondylolisthesis, lumbar region: Secondary | ICD-10-CM

## 2019-06-13 HISTORY — DX: Spondylosis without myelopathy or radiculopathy, lumbar region: M47.816

## 2019-06-13 HISTORY — DX: Spondylolisthesis, lumbar region: M43.16

## 2019-07-08 ENCOUNTER — Ambulatory Visit (INDEPENDENT_AMBULATORY_CARE_PROVIDER_SITE_OTHER): Payer: BC Managed Care – PPO | Admitting: Cardiology

## 2019-07-08 ENCOUNTER — Encounter: Payer: Self-pay | Admitting: Cardiology

## 2019-07-08 ENCOUNTER — Other Ambulatory Visit: Payer: Self-pay

## 2019-07-08 VITALS — BP 140/82 | HR 75 | Temp 99.0°F | Ht 65.0 in

## 2019-07-08 DIAGNOSIS — R002 Palpitations: Secondary | ICD-10-CM

## 2019-07-08 DIAGNOSIS — I10 Essential (primary) hypertension: Secondary | ICD-10-CM | POA: Diagnosis not present

## 2019-07-08 NOTE — Progress Notes (Signed)
Cardiology Office Note:    Date:  07/08/2019   ID:  Beth Wright, DOB 03/30/1956, MRN [id number]  PCP:  Wayland Salinas, MD  Cardiologist:  Jenean Lindau, MD   Referring MD: Wayland Salinas, *    ASSESSMENT:    1. Essential hypertension   2. Palpitations    PLAN:    In order of problems listed above:  1. Primary prevention stressed with the patient.  Importance of compliance with diet and medication stressed and she vocalized understanding. 2. Essential hypertension: Her blood pressure stable.  She wants to keep a track of it twice a day and put it in the mail for me in a week.  She mentions to me that her stress level because of domestic issues may have been driving her blood pressure lately on the higher side. 3. Palpitations: These have resolved.  Importance of regular walking at least half an hour 5 days a week was emphasized and she promises to do so. 4. Patient will be seen in follow-up appointment in 6 months or earlier if the patient has any concerns    Medication Adjustments/Labs and Tests Ordered: Current medicines are reviewed at length with the patient today.  Concerns regarding medicines are outlined above.  No orders of the defined types were placed in this encounter.  No orders of the defined types were placed in this encounter.    No chief complaint on file.    History of Present Illness:    Beth Wright is a 63 y.o. female.  Patient has past medical history of essential hypertension and palpitations.  She mentions to me that she is under a lot of stress in the past several weeks.  No chest pain orthopnea or PND.  She leads a sedentary lifestyle and tells me that she has gained weight.  She refused to be weighed today.  At the time of my evaluation, the patient is alert awake oriented and in no distress.  Her palpitations have resolved.  Past Medical History:  Diagnosis Date  . Abdominal pain, epigastric   . Arthritis    . Chronic bronchitis   . Fatigue   . Generalized headaches   . GERD (gastroesophageal reflux disease)   . Hernia   . Trouble swallowing     Past Surgical History:  Procedure Laterality Date  . ESOPHAGOGASTRODUODENOSCOPY N/A 05/18/2015   Procedure: ESOPHAGOGASTRODUODENOSCOPY (EGD);  Surgeon: Alphonsa Overall, MD;  Location: [facility];  Service: General;  Laterality: N/A;  . ESOPHAGOGASTRODUODENOSCOPY (EGD) WITH PROPOFOL N/A 05/07/2017   Procedure: ESOPHAGOGASTRODUODENOSCOPY (EGD) WITH PROPOFOL;  Surgeon: Johnathan Hausen, MD;  Location: [facility];  Service: General;  Laterality: N/A;  . LAPAROSCOPIC GASTRIC BANDING  10/2010   replacement of original  . MENISCUS REPAIR  2005  . SPINAL FUSION     c4-c7 w/ cadaver bone  . TMJ ARTHROPLASTY  90/92    Current Medications: Current Meds  Medication Sig  . ALPRAZolam (XANAX) 1 MG tablet Take 0.5 mg by mouth daily as needed for anxiety.  Marland Kitchen aspirin 81 MG tablet Take 81 mg by mouth daily.   . cetirizine (ZYRTEC) 10 MG tablet Take 10 mg by mouth daily.  . Cholecalciferol (VITAMIN D3) 3000 units TABS Take by mouth.  . Docosahexaenoic Acid 200 MG CAPS Take 3,200 mg by mouth daily.  . famotidine (PEPCID) 10 MG tablet Take 10 mg by mouth daily.  . hydrochlorothiazide (HYDRODIURIL) 25 MG tablet Take by mouth.  . LUTEIN PO Take by  mouth.    . Nutritional Supplements (DHEA) 15-50 MG CAPS Take 1 tablet by mouth daily.  Marland Kitchen omalizumab (XOLAIR) 150 MG injection Inject 150 mg into the skin every 28 (twenty-eight) days.  . OSPHENA 60 MG TABS Take 60 mg by mouth daily.  Marland Kitchen OVER THE COUNTER MEDICATION Take 1,000 mcg by mouth daily. Optimized folate  . OVER THE COUNTER MEDICATION Take 370 mg by mouth daily. Cura-Med Estrogen Balance  . PHYTONADIONE PO Take by mouth. Vitamin K-1  . Pregnenolone POWD Take 50 mg by mouth daily.  . progesterone (PROMETRIUM) 200 MG capsule Take 200 mg by mouth at bedtime.  Marland Kitchen VITAMIN K PO Take 2,700 mg by mouth daily.      Allergies:   Patient has no known allergies.   Social History   Socioeconomic History  . Marital status: Married    Spouse name: Not on file  . Number of children: Not on file  . Years of education: Not on file  . Highest education level: Not on file  Occupational History  . Not on file  Social Needs  . Financial resource strain: Not on file  . Food insecurity    Worry: Not on file    Inability: Not on file  . Transportation needs    Medical: Not on file    Non-medical: Not on file  Tobacco Use  . Smoking status: Never Smoker  . Smokeless tobacco: Never Used  Substance and Sexual Activity  . Alcohol use: Yes    Alcohol/week: 1.0 - 2.0 standard drinks    Types: 1 - 2 Glasses of wine per week    Comment: 2-3 GLASSES OF WINE PER WEEK  . Drug use: No  . Sexual activity: Not on file  Lifestyle  . Physical activity    Days per week: Not on file    Minutes per session: Not on file  . Stress: Not on file  Relationships  . Social Herbalist on phone: Not on file    Gets together: Not on file    Attends religious service: Not on file    Active member of club or organization: Not on file    Attends meetings of clubs or organizations: Not on file    Relationship status: Not on file  Other Topics Concern  . Not on file  Social History Narrative  . Not on file     Family History: The patient's family history includes Acute myelogenous leukemia in her sister; Cancer in her mother; Stroke in her father.  ROS:   Please see the history of present illness.    All other systems reviewed and are negative.  EKGs/Labs/Other Studies Reviewed:    The following studies were reviewed today: EKG reveals sinus rhythm and nonspecific ST-T changes   Recent Labs: No results found for requested labs within last 8760 hours.  Recent Lipid Panel    Component Value Date/Time   CHOL (H) 11/11/2010 1400    208        ATP III CLASSIFICATION:  <200     mg/dL   Desirable   200-239  mg/dL   Borderline High  >=240    mg/dL   High          TRIG 98 11/11/2010 1400   HDL 64 11/11/2010 1400   CHOLHDL 3.3 11/11/2010 1400   VLDL 20 11/11/2010 1400   LDLCALC (H) 11/11/2010 1400    124        Total  Cholesterol/HDL:CHD Risk Coronary Heart Disease Risk Table                     Men   Women  1/2 Average Risk   3.4   3.3  Average Risk       5.0   4.4  2 X Average Risk   9.6   7.1  3 X Average Risk  23.4   11.0        Use the calculated Patient Ratio above and the CHD Risk Table to determine the patient's CHD Risk.        ATP III CLASSIFICATION (LDL):  <100     mg/dL   Optimal  100-129  mg/dL   Near or Above                    Optimal  130-159  mg/dL   Borderline  160-189  mg/dL   High  >190     mg/dL   Very High    Physical Exam:    VS:  BP 140/82 (BP Location: Right Arm, Patient Position: Sitting)   Pulse 75   Temp 99 F (37.2 C)   Ht 5\' 5"  (1.651 m)   LMP 05/18/2015 (Within Years)   BMI 25.29 kg/m     Wt Readings from Last 3 Encounters:  10/10/18 152 lb (68.9 kg)  05/07/17 151 lb (68.5 kg)  05/18/15 162 lb (73.5 kg)     GEN: Patient is in no acute distress HEENT: Normal NECK: No JVD; No carotid bruits LYMPHATICS: No lymphadenopathy CARDIAC: Hear sounds regular, 2/6 systolic murmur at the apex. RESPIRATORY:  Clear to auscultation without rales, wheezing or rhonchi  ABDOMEN: Soft, non-tender, non-distended MUSCULOSKELETAL:  No edema; No deformity  SKIN: Warm and dry NEUROLOGIC:  Alert and oriented x 3 PSYCHIATRIC:  Normal affect   Signed, Jenean Lindau, MD  07/08/2019 2:04 PM    [place]

## 2019-07-08 NOTE — Addendum Note (Signed)
Addended by: Beckey Rutter on: 07/08/2019 02:17 PM   Modules accepted: Orders

## 2019-07-08 NOTE — Patient Instructions (Addendum)
Medication Instructions:  Your physician recommends that you continue on your current medications as directed. Please refer to the Current Medication list given to you today.  If you need a refill on your cardiac medications before your next appointment, please call your pharmacy.   Lab work: NONE If you have labs (blood work) drawn today and your tests are completely normal, you will receive your results only by: . MyChart Message (if you have MyChart) OR . A paper copy in the mail If you have any lab test that is abnormal or we need to change your treatment, we will call you to review the results.  Testing/Procedures: You had an EKG performed today  Follow-Up: At [facility], you and your health needs are our priority.  As part of our continuing mission to provide you with exceptional heart care, we have created designated Provider Care Teams.  These Care Teams include your primary Cardiologist (physician) and Advanced Practice Providers (APPs -  Physician Assistants and Nurse Practitioners) who all work together to provide you with the care you need, when you need it. You will need a follow up appointment in 6 months.   

## 2020-01-20 ENCOUNTER — Ambulatory Visit: Payer: BC Managed Care – PPO | Admitting: Cardiology

## 2020-01-27 ENCOUNTER — Other Ambulatory Visit: Payer: Self-pay

## 2020-01-27 ENCOUNTER — Ambulatory Visit: Payer: BC Managed Care – PPO | Admitting: Cardiology

## 2020-01-27 ENCOUNTER — Encounter: Payer: Self-pay | Admitting: Cardiology

## 2020-01-27 ENCOUNTER — Other Ambulatory Visit: Payer: Self-pay | Admitting: *Deleted

## 2020-01-27 VITALS — BP 130/84 | HR 85 | Ht 65.0 in

## 2020-01-27 DIAGNOSIS — I10 Essential (primary) hypertension: Secondary | ICD-10-CM

## 2020-01-27 MED ORDER — METOPROLOL SUCCINATE ER 25 MG PO TB24: 25.0000 mg | ORAL_TABLET | Freq: Every day | ORAL | 12 refills | Status: DC

## 2020-01-27 NOTE — Patient Instructions (Signed)
Medication Instructions:  Your physician has recommended you make the following change in your medication:   Stop Hydrochlorothiazide. Start Toprol XL 25 mg daily.  *If you need a refill on your cardiac medications before your next appointment, please call your pharmacy*   Lab Work: None ordered If you have labs (blood work) drawn today and your tests are completely normal, you will receive your results only by: Marland Kitchen MyChart Message (if you have MyChart) OR . A paper copy in the mail If you have any lab test that is abnormal or we need to change your treatment, we will call you to review the results.   Testing/Procedures: None ordered   Follow-Up: At [facility], you and your health needs are our priority.  As part of our continuing mission to provide you with exceptional heart care, we have created designated Provider Care Teams.  These Care Teams include your primary Cardiologist (physician) and Advanced Practice Providers (APPs -  Physician Assistants and Nurse Practitioners) who all work together to provide you with the care you need, when you need it.  We recommend signing up for the patient portal called "MyChart".  Sign up information is provided on this After Visit Summary.  MyChart is used to connect with patients for Virtual Visits (Telemedicine).  Patients are able to view lab/test results, encounter notes, upcoming appointments, etc.  Non-urgent messages can be sent to your provider as well.   To learn more about what you can do with MyChart, go to [email or web address].    Your next appointment:   6 month(s)  The format for your next appointment:   In Person  Provider:   Jyl Heinz, MD   Other Instructions NA

## 2020-01-27 NOTE — Progress Notes (Signed)
Cardiology Office Note:    Date:  01/27/2020   ID:  Beth Wright, DOB [birth year], MRN [id number]  PCP:  Wayland Salinas, MD  Cardiologist:  Jenean Lindau, MD   Referring MD: Wayland Salinas, *    ASSESSMENT:    1. Essential hypertension    PLAN:    In order of problems listed above:  1. Primary prevention stressed with the patient.  Importance of compliance with diet and medication stressed and she vocalized understanding. 2. Palpitations and anxiety: She has had blood work done recently.  She tells me that her thyroid testing was within normal limits.  In view of this I will change her blood pressure medication from hydrochlorothiazide to Toprol-XL 25 mg daily it will help her blood pressure and her palpitations.  If this recurs she will be undergoing further evaluation. 3. Essential hypertension: Blood pressure is stable.  She has an element of whitecoat hypertension.  She appears anxious on this visit today. 4. Patient will be seen in follow-up appointment in 6 months or earlier if the patient has any concerns    Medication Adjustments/Labs and Tests Ordered: Current medicines are reviewed at length with the patient today.  Concerns regarding medicines are outlined above.  No orders of the defined types were placed in this encounter.  No orders of the defined types were placed in this encounter.    Chief Complaint  Patient presents with  . Follow-up    6 Months     History of Present Illness:    Beth Wright is a 64 y.o. female.  Patient has past medical history of high blood pressure.  She mentions to me that she has been under significant domestic stress and experiences palpitations at times.  She denies any chest pain orthopnea or PND.  She is walking on a regular basis.  At the time of my evaluation, the patient is alert awake oriented and in no distress.  Past Medical History:  Diagnosis Date  . Abdominal pain, epigastric   .  Arthritis   . Chronic bronchitis   . Fatigue   . Generalized headaches   . GERD (gastroesophageal reflux disease)   . Hernia   . Trouble swallowing     Past Surgical History:  Procedure Laterality Date  . ESOPHAGOGASTRODUODENOSCOPY N/A 05/18/2015   Procedure: ESOPHAGOGASTRODUODENOSCOPY (EGD);  Surgeon: Alphonsa Overall, MD;  Location: [facility];  Service: General;  Laterality: N/A;  . ESOPHAGOGASTRODUODENOSCOPY (EGD) WITH PROPOFOL N/A 05/07/2017   Procedure: ESOPHAGOGASTRODUODENOSCOPY (EGD) WITH PROPOFOL;  Surgeon: Johnathan Hausen, MD;  Location: [facility];  Service: General;  Laterality: N/A;  . LAPAROSCOPIC GASTRIC BANDING  10/2010   replacement of original  . MENISCUS REPAIR  2005  . SPINAL FUSION     c4-c7 w/ cadaver bone  . TMJ ARTHROPLASTY  90/92    Current Medications: Current Meds  Medication Sig  . ALPRAZolam (XANAX) 1 MG tablet Take 0.5 mg by mouth daily as needed for anxiety.  Marland Kitchen aspirin 81 MG tablet Take 81 mg by mouth daily.   . Aspirin Buf,CaCarb-MgCarb-MgO, 81 MG TABS Take by mouth.  . cetirizine (ZYRTEC) 10 MG tablet Take 10 mg by mouth daily.  . Cholecalciferol (VITAMIN D3) 3000 units TABS Take by mouth.  . Docosahexaenoic Acid 200 MG CAPS Take 3,200 mg by mouth daily.  Marland Kitchen estradiol (VIVELLE-DOT) 0.05 MG/24HR patch APPLY EVERY 3 DAYS  . famotidine (PEPCID) 10 MG tablet Take 10 mg by mouth daily.  . hydrochlorothiazide (HYDRODIURIL)  25 MG tablet Take by mouth.  . LUTEIN PO Take by mouth.    . Nutritional Supplements (DHEA) 15-50 MG CAPS Take 1 tablet by mouth daily.  Marland Kitchen omalizumab (XOLAIR) 150 MG injection Inject 150 mg into the skin every 28 (twenty-eight) days.  . OSPHENA 60 MG TABS Take 60 mg by mouth daily.  Marland Kitchen OVER THE COUNTER MEDICATION Take 1,000 mcg by mouth daily. Optimized folate  . OVER THE COUNTER MEDICATION Take 370 mg by mouth daily. Cura-Med Estrogen Balance  . OVER THE COUNTER MEDICATION Take 2 tablets by mouth daily. Mind-Max  . PHYTONADIONE PO  Take by mouth. Vitamin K-1  . Pregnenolone POWD Take 50 mg by mouth daily.  . progesterone (PROMETRIUM) 200 MG capsule Take 200 mg by mouth at bedtime.  . sertraline (ZOLOFT) 25 MG tablet Take by mouth.  . Testosterone Propionate (FIRST-TESTOSTERONE MC) 2 % CREA APPLY 99991111 GRAM (1-2 CLICKS) DAILY BEHIND AS DIRECTED  . VITAMIN K PO Take 2,700 mg by mouth daily.     Allergies:   Patient has no known allergies.   Social History   Socioeconomic History  . Marital status: Married    Spouse name: Not on file  . Number of children: Not on file  . Years of education: Not on file  . Highest education level: Not on file  Occupational History  . Not on file  Tobacco Use  . Smoking status: Never Smoker  . Smokeless tobacco: Never Used  Substance and Sexual Activity  . Alcohol use: Yes    Alcohol/week: 1.0 - 2.0 standard drinks    Types: 1 - 2 Glasses of wine per week    Comment: 2-3 GLASSES OF WINE PER WEEK  . Drug use: No  . Sexual activity: Not on file  Other Topics Concern  . Not on file  Social History Narrative  . Not on file   Social Determinants of Health   Financial Resource Strain:   . Difficulty of Paying Living Expenses: Not on file  Food Insecurity:   . Worried About Charity fundraiser in the Last Year: Not on file  . Ran Out of Food in the Last Year: Not on file  Transportation Needs:   . Lack of Transportation (Medical): Not on file  . Lack of Transportation (Non-Medical): Not on file  Physical Activity:   . Days of Exercise per Week: Not on file  . Minutes of Exercise per Session: Not on file  Stress:   . Feeling of Stress : Not on file  Social Connections:   . Frequency of Communication with Friends and Family: Not on file  . Frequency of Social Gatherings with Friends and Family: Not on file  . Attends Religious Services: Not on file  . Active Member of Clubs or Organizations: Not on file  . Attends Archivist Meetings: Not on file  . Marital  Status: Not on file     Family History: The patient's family history includes Acute myelogenous leukemia in her sister; Cancer in her mother; Stroke in her father.  ROS:   Please see the history of present illness.    All other systems reviewed and are negative.  EKGs/Labs/Other Studies Reviewed:    The following studies were reviewed today: I discussed my findings with the patient at length.   Recent Labs: No results found for requested labs within last 8760 hours.  Recent Lipid Panel    Component Value Date/Time   CHOL (H) 11/11/2010 1400  208        ATP III CLASSIFICATION:  <200     mg/dL   Desirable  200-239  mg/dL   Borderline High  >=240    mg/dL   High          TRIG 98 11/11/2010 1400   HDL 64 11/11/2010 1400   CHOLHDL 3.3 11/11/2010 1400   VLDL 20 11/11/2010 1400   LDLCALC (H) 11/11/2010 1400    124        Total Cholesterol/HDL:CHD Risk Coronary Heart Disease Risk Table                     Men   Women  1/2 Average Risk   3.4   3.3  Average Risk       5.0   4.4  2 X Average Risk   9.6   7.1  3 X Average Risk  23.4   11.0        Use the calculated Patient Ratio above and the CHD Risk Table to determine the patient's CHD Risk.        ATP III CLASSIFICATION (LDL):  <100     mg/dL   Optimal  100-129  mg/dL   Near or Above                    Optimal  130-159  mg/dL   Borderline  160-189  mg/dL   High  >190     mg/dL   Very High    Physical Exam:    VS:  BP 130/84   Pulse 85   Ht 5\' 5"  (1.651 m)   LMP 05/18/2015 (Within Years)   SpO2 99%   BMI 25.29 kg/m     Wt Readings from Last 3 Encounters:  10/10/18 152 lb (68.9 kg)  05/07/17 151 lb (68.5 kg)  05/18/15 162 lb (73.5 kg)     GEN: Patient is in no acute distress HEENT: Normal NECK: No JVD; No carotid bruits LYMPHATICS: No lymphadenopathy CARDIAC: Hear sounds regular, 2/6 systolic murmur at the apex. RESPIRATORY:  Clear to auscultation without rales, wheezing or rhonchi  ABDOMEN: Soft,  non-tender, non-distended MUSCULOSKELETAL:  No edema; No deformity  SKIN: Warm and dry NEUROLOGIC:  Alert and oriented x 3 PSYCHIATRIC:  Normal affect   Signed, Jenean Lindau, MD  01/27/2020 3:01 PM    [facility]

## 2020-01-27 NOTE — Addendum Note (Signed)
Addended by: Truddie Hidden on: 01/27/2020 03:11 PM   Modules accepted: Orders

## 2020-06-14 DIAGNOSIS — Z981 Arthrodesis status: Secondary | ICD-10-CM

## 2020-06-14 HISTORY — DX: Arthrodesis status: Z98.1

## 2020-08-10 ENCOUNTER — Other Ambulatory Visit: Payer: Self-pay

## 2020-08-10 DIAGNOSIS — R1310 Dysphagia, unspecified: Secondary | ICD-10-CM | POA: Insufficient documentation

## 2020-08-10 DIAGNOSIS — R5383 Other fatigue: Secondary | ICD-10-CM | POA: Insufficient documentation

## 2020-08-10 DIAGNOSIS — R1013 Epigastric pain: Secondary | ICD-10-CM | POA: Insufficient documentation

## 2020-08-10 DIAGNOSIS — R519 Headache, unspecified: Secondary | ICD-10-CM | POA: Insufficient documentation

## 2020-08-11 ENCOUNTER — Ambulatory Visit: Payer: BC Managed Care – PPO | Admitting: Cardiology

## 2020-08-11 ENCOUNTER — Encounter: Payer: Self-pay | Admitting: Cardiology

## 2020-08-11 ENCOUNTER — Other Ambulatory Visit: Payer: Self-pay

## 2020-08-11 VITALS — BP 144/84 | HR 74 | Ht 65.0 in | Wt 153.0 lb

## 2020-08-11 DIAGNOSIS — I10 Essential (primary) hypertension: Secondary | ICD-10-CM

## 2020-08-11 DIAGNOSIS — R002 Palpitations: Secondary | ICD-10-CM

## 2020-08-11 NOTE — Progress Notes (Signed)
Cardiology Office Note:    Date:  08/11/2020   ID:  Beth Wright, DOB 03-04-1956, MRN [id number]  PCP:  Wayland Salinas, MD  Cardiologist:  Jenean Lindau, MD   Referring MD: Wayland Salinas, *    ASSESSMENT:    1. Essential hypertension   2. Palpitations    PLAN:    In order of problems listed above:  1. Primary prevention stressed with the patient.  Importance of compliance with diet medication stressed and she vocalized understanding.  I reviewed her exercise program and it is excellent. 2. Palpitations: These have resolved and she is very happy about it. 3. Patient will be seen in follow-up appointment in 6 months or earlier if the patient has any concerns    Medication Adjustments/Labs and Tests Ordered: Current medicines are reviewed at length with the patient today.  Concerns regarding medicines are outlined above.  No orders of the defined types were placed in this encounter.  No orders of the defined types were placed in this encounter.    No chief complaint on file.    History of Present Illness:    Beth Wright is a 64 y.o. female.  Patient has past medical history of essential hypertension and palpitations.  He denies any problems at this time and does have daily cough.  No chest pain orthopnea or PND.  She tells me that her stress level is much better and her palpitations are better.  She walks on a regular basis.  At the time of my evaluation, the patient is alert awake oriented and in no distress.  Past Medical History:  Diagnosis Date  . Abdominal pain, epigastric   . Arrhythmia 11/29/2017  . Arthritis   . Arthrodesis status 06/14/2020  . Cervical spondylosis 08/21/2013  . Chest pain, atypical 12/04/2017  . Chronic bronchitis   . Chronic idiopathic urticaria 05/17/2017  . Facet arthritis of lumbar region 06/13/2019  . Fatigue   . FH: diabetes mellitus 11/18/2014  . Fibroids 12/08/2015  . Fitting and adjustment of  gastric lap band 03/07/2013  . Generalized headaches   . GERD (gastroesophageal reflux disease)   . Hernia   . Hypertension 07/03/2018  . Menopausal symptoms 10/12/2011  . 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 Palpitations 10/12/2011  . Pseudophakia of both eyes 07/02/2016  . PVD (posterior vitreous detachment), left eye 07/02/2016  . S/P diskectomy 03/07/2013  . Spider veins 11/13/2011  . Spondylolisthesis at L4-L5 level 06/13/2019  . SUI (stress urinary incontinence, female) 12/08/2015  . Trouble swallowing   . Varicose veins of bilateral lower extremities with other complications 98/92/1194   Formatting of this note might be different from the original. IMO 2019 R1.0 Update    Past Surgical History:  Procedure Laterality Date  . ESOPHAGOGASTRODUODENOSCOPY N/A 05/18/2015   Procedure: ESOPHAGOGASTRODUODENOSCOPY (EGD);  Surgeon: Alphonsa Overall, MD;  Location: [facility];  Service: General;  Laterality: N/A;  . ESOPHAGOGASTRODUODENOSCOPY (EGD) WITH PROPOFOL N/A 05/07/2017   Procedure: ESOPHAGOGASTRODUODENOSCOPY (EGD) WITH PROPOFOL;  Surgeon: Johnathan Hausen, MD;  Location: [facility];  Service: General;  Laterality: N/A;  . LAPAROSCOPIC GASTRIC BANDING  10/2010   replacement of original  . MENISCUS REPAIR  2005  . SPINAL FUSION     c4-c7 w/ cadaver bone  . TMJ ARTHROPLASTY  90/92    Current Medications: Current Meds  Medication Sig  . ALPRAZolam (XANAX) 1 MG tablet Take 0.5 mg by mouth daily as needed for anxiety.  . cetirizine (ZYRTEC) 10 MG  tablet Take 10 mg by mouth daily.  . Cholecalciferol (VITAMIN D3) 3000 units TABS Take by mouth.  . Docosahexaenoic Acid 200 MG CAPS Take 3,200 mg by mouth daily.  Marland Kitchen estradiol (VIVELLE-DOT) 0.05 MG/24HR patch APPLY EVERY 3 DAYS  . famotidine (PEPCID) 10 MG tablet Take 10 mg by mouth daily.  . LUTEIN PO Take by mouth.    . meloxicam (MOBIC) 15 MG tablet Take 15 mg by mouth daily.  . metoprolol succinate (TOPROL XL) 25 MG 24 hr tablet Take 1  tablet (25 mg total) by mouth daily.  . Nutritional Supplements (DHEA) 15-50 MG CAPS Take 1 tablet by mouth daily.  Marland Kitchen omalizumab (XOLAIR) 150 MG injection Inject 150 mg into the skin every 4 (four) months.   Marland Kitchen OVER THE COUNTER MEDICATION Take 1,000 mcg by mouth daily. Optimized folate  . OVER THE COUNTER MEDICATION Take 2 tablets by mouth daily. Mind-Max  . Pregnenolone POWD Take 50 mg by mouth daily.  . progesterone (PROMETRIUM) 200 MG capsule Take 200 mg by mouth at bedtime.  Marland Kitchen zinc gluconate 50 MG tablet Take 50 mg by mouth daily.     Allergies:   Patient has no known allergies.   Social History   Socioeconomic History  . Marital status: Married    Spouse name: Not on file  . Number of children: Not on file  . Years of education: Not on file  . Highest education level: Not on file  Occupational History  . Not on file  Tobacco Use  . Smoking status: Never Smoker  . Smokeless tobacco: Never Used  Substance and Sexual Activity  . Alcohol use: Yes    Alcohol/week: 1.0 - 2.0 standard drink    Types: 1 - 2 Glasses of wine per week    Comment: 2-3 GLASSES OF WINE PER WEEK  . Drug use: No  . Sexual activity: Not on file  Other Topics Concern  . Not on file  Social History Narrative  . Not on file   Social Determinants of Health   Financial Resource Strain:   . Difficulty of Paying Living Expenses: Not on file  Food Insecurity:   . Worried About Charity fundraiser in the Last Year: Not on file  . Ran Out of Food in the Last Year: Not on file  Transportation Needs:   . Lack of Transportation (Medical): Not on file  . Lack of Transportation (Non-Medical): Not on file  Physical Activity:   . Days of Exercise per Week: Not on file  . Minutes of Exercise per Session: Not on file  Stress:   . Feeling of Stress : Not on file  Social Connections:   . Frequency of Communication with Friends and Family: Not on file  . Frequency of Social Gatherings with Friends and Family: Not  on file  . Attends Religious Services: Not on file  . Active Member of Clubs or Organizations: Not on file  . Attends Archivist Meetings: Not on file  . Marital Status: Not on file     Family History: The patient's family history includes Acute myelogenous leukemia in her sister; Cancer in her mother; Stroke in her father.  ROS:   Please see the history of present illness.    All other systems reviewed and are negative.  EKGs/Labs/Other Studies Reviewed:    The following studies were reviewed today: I discussed my findings with the patient at length.  EKG revealed sinus rhythm and nonspecific ST-T changes.  Recent Labs: No results found for requested labs within last 8760 hours.  Recent Lipid Panel    Component Value Date/Time   CHOL (H) 11/11/2010 1400    208        ATP III CLASSIFICATION:  <200     mg/dL   Desirable  200-239  mg/dL   Borderline High  >=240    mg/dL   High          TRIG 98 11/11/2010 1400   HDL 64 11/11/2010 1400   CHOLHDL 3.3 11/11/2010 1400   VLDL 20 11/11/2010 1400   LDLCALC (H) 11/11/2010 1400    124        Total Cholesterol/HDL:CHD Risk Coronary Heart Disease Risk Table                     Men   Women  1/2 Average Risk   3.4   3.3  Average Risk       5.0   4.4  2 X Average Risk   9.6   7.1  3 X Average Risk  23.4   11.0        Use the calculated Patient Ratio above and the CHD Risk Table to determine the patient's CHD Risk.        ATP III CLASSIFICATION (LDL):  <100     mg/dL   Optimal  100-129  mg/dL   Near or Above                    Optimal  130-159  mg/dL   Borderline  160-189  mg/dL   High  >190     mg/dL   Very High    Physical Exam:    VS:  BP (!) 144/84   Pulse 74   Ht 5\' 5"  (1.651 m)   Wt 153 lb (69.4 kg)   LMP 05/18/2015 (Within Years)   SpO2 97%   BMI 25.46 kg/m     Wt Readings from Last 3 Encounters:  08/11/20 153 lb (69.4 kg)  10/10/18 152 lb (68.9 kg)  05/07/17 151 lb (68.5 kg)     GEN:  Patient is in no acute distress HEENT: Normal NECK: No JVD; No carotid bruits LYMPHATICS: No lymphadenopathy CARDIAC: Hear sounds regular, 2/6 systolic murmur at the apex. RESPIRATORY:  Clear to auscultation without rales, wheezing or rhonchi  ABDOMEN: Soft, non-tender, non-distended MUSCULOSKELETAL:  No edema; No deformity  SKIN: Warm and dry NEUROLOGIC:  Alert and oriented x 3 PSYCHIATRIC:  Normal affect   Signed, Jenean Lindau, MD  08/11/2020 1:54 PM    [facility]

## 2020-08-11 NOTE — Patient Instructions (Signed)

## 2020-11-09 ENCOUNTER — Other Ambulatory Visit: Payer: Self-pay

## 2020-11-09 MED ORDER — METOPROLOL SUCCINATE ER 25 MG PO TB24: 25.0000 mg | ORAL_TABLET | Freq: Every day | ORAL | 2 refills | Status: AC

## 2020-11-09 NOTE — Telephone Encounter (Signed)
Refill of Metoprolol Succinate 25 mg sent to [facility].  

## 2021-02-08 ENCOUNTER — Ambulatory Visit: Payer: BC Managed Care – PPO | Admitting: Cardiology

## 2021-10-13 ENCOUNTER — Other Ambulatory Visit: Payer: Self-pay

## 2021-10-13 ENCOUNTER — Other Ambulatory Visit (HOSPITAL_COMMUNITY): Payer: Self-pay | Admitting: Surgery

## 2021-10-13 ENCOUNTER — Ambulatory Visit (HOSPITAL_COMMUNITY)
Admission: RE | Admit: 2021-10-13 | Discharge: 2021-10-13 | Disposition: A | Discharge status: Home / Self Care | Payer: Medicare Other | Source: Ambulatory Visit | Attending: Surgery | Admitting: Surgery

## 2021-10-13 ENCOUNTER — Other Ambulatory Visit: Payer: Self-pay | Admitting: Surgery

## 2021-10-13 DIAGNOSIS — R1030 Lower abdominal pain, unspecified: Secondary | ICD-10-CM | POA: Insufficient documentation

## 2021-10-13 LAB — POCT I-STAT CREATININE: Creatinine, Ser: 0.7 mg/dL (ref 0.44–1.00)

## 2021-10-13 MED ORDER — DIATRIZOATE MEGLUMINE & SODIUM 66-10 % PO SOLN: 30.0000 mL | Freq: Once | ORAL | Status: AC

## 2021-10-13 MED ORDER — IOHEXOL 350 MG/ML SOLN
80.0000 mL | Freq: Once | INTRAVENOUS | Status: AC | PRN
  Administered 2021-10-13: 13:00:00 80 mL via INTRAVENOUS

## 2021-10-13 MED ORDER — DIATRIZOATE MEGLUMINE & SODIUM 66-10 % PO SOLN
ORAL | Status: AC
  Administered 2021-10-13: 11:00:00 30 mL via ORAL
  Filled 2021-10-13: qty 30

## 2021-10-13 MED ORDER — SODIUM CHLORIDE (PF) 0.9 % IJ SOLN
INTRAMUSCULAR | Status: AC
  Filled 2021-10-13: qty 50
